# Patient Record
Sex: Female | Born: 1985 | Race: White | Hispanic: No | Marital: Single | Smoking: Current every day smoker
Health system: Southern US, Community
[De-identification: ages and names within clinical notes are randomized; demographics above are authoritative.]

## PROBLEM LIST (undated history)

## (undated) DIAGNOSIS — M069 Rheumatoid arthritis, unspecified: Secondary | ICD-10-CM

## (undated) DIAGNOSIS — R030 Elevated blood-pressure reading, without diagnosis of hypertension: Secondary | ICD-10-CM

## (undated) DIAGNOSIS — O1490 Unspecified pre-eclampsia, unspecified trimester: Secondary | ICD-10-CM

## (undated) HISTORY — PX: DILATION AND CURETTAGE OF UTERUS: SHX78

## (undated) HISTORY — DX: Unspecified pre-eclampsia, unspecified trimester: O14.90

## (undated) HISTORY — DX: Rheumatoid arthritis, unspecified: M06.9

---

## 2009-12-13 DIAGNOSIS — O1490 Unspecified pre-eclampsia, unspecified trimester: Secondary | ICD-10-CM

## 2018-02-07 ENCOUNTER — Encounter: Payer: Self-pay | Admitting: *Deleted

## 2018-02-10 ENCOUNTER — Encounter: Payer: Self-pay | Admitting: Obstetrics & Gynecology

## 2018-02-10 ENCOUNTER — Other Ambulatory Visit (HOSPITAL_COMMUNITY)
Admission: RE | Admit: 2018-02-10 | Discharge: 2018-02-10 | Disposition: A | Discharge status: Home / Self Care | Payer: Medicaid Other | Source: Ambulatory Visit | Attending: Obstetrics & Gynecology | Admitting: Obstetrics & Gynecology

## 2018-02-10 ENCOUNTER — Telehealth: Payer: Self-pay | Admitting: *Deleted

## 2018-02-10 ENCOUNTER — Ambulatory Visit (INDEPENDENT_AMBULATORY_CARE_PROVIDER_SITE_OTHER): Payer: Medicaid Other | Admitting: Obstetrics & Gynecology

## 2018-02-10 ENCOUNTER — Ambulatory Visit: Payer: Medicaid Other | Admitting: Clinical

## 2018-02-10 VITALS — BP 119/77 | HR 105 | Ht 62.0 in | Wt 160.0 lb

## 2018-02-10 DIAGNOSIS — O219 Vomiting of pregnancy, unspecified: Secondary | ICD-10-CM

## 2018-02-10 DIAGNOSIS — O0992 Supervision of high risk pregnancy, unspecified, second trimester: Secondary | ICD-10-CM | POA: Diagnosis present

## 2018-02-10 DIAGNOSIS — Z3A22 22 weeks gestation of pregnancy: Secondary | ICD-10-CM | POA: Diagnosis not present

## 2018-02-10 DIAGNOSIS — O4401 Placenta previa specified as without hemorrhage, first trimester: Secondary | ICD-10-CM

## 2018-02-10 DIAGNOSIS — O4402 Placenta previa specified as without hemorrhage, second trimester: Secondary | ICD-10-CM | POA: Diagnosis not present

## 2018-02-10 DIAGNOSIS — O0990 Supervision of high risk pregnancy, unspecified, unspecified trimester: Secondary | ICD-10-CM

## 2018-02-10 DIAGNOSIS — Z8759 Personal history of other complications of pregnancy, childbirth and the puerperium: Secondary | ICD-10-CM

## 2018-02-10 DIAGNOSIS — Z23 Encounter for immunization: Secondary | ICD-10-CM

## 2018-02-10 DIAGNOSIS — O09292 Supervision of pregnancy with other poor reproductive or obstetric history, second trimester: Secondary | ICD-10-CM | POA: Diagnosis not present

## 2018-02-10 MED ORDER — ONDANSETRON 4 MG PO TBDP: 4.0000 mg | ORAL_TABLET | Freq: Four times a day (QID) | ORAL | 0 refills | Status: DC | PRN

## 2018-02-10 MED ORDER — ASPIRIN EC 81 MG PO TBEC: 81.0000 mg | DELAYED_RELEASE_TABLET | Freq: Every day | ORAL | 2 refills | Status: DC

## 2018-02-10 MED ORDER — FAMOTIDINE 20 MG PO TABS: 20.0000 mg | ORAL_TABLET | Freq: Two times a day (BID) | ORAL | 3 refills | Status: DC

## 2018-02-10 NOTE — Patient Instructions (Signed)
AREA PEDIATRIC/FAMILY PRACTICE PHYSICIANS  [facility] [address], [address] [place], [place]  [address] Phone - [phone]   Fax - [phone]  [facility] [address] [place], [place] [address] Phone - [phone]   Fax - [phone]  [facility] [address] [place], [place]  [address] Phone - [phone]   Fax - [phone]  [facility] [address] [place], [place]  [address] Phone - [phone]   Fax - [phone]  [facility] [address] [place], [place]  [address] Phone - [phone]   Fax - [phone]  [facility] [address] [place], [place]  [address] Phone - [phone]   Fax - [phone]  [facility] [address] [place], [place]  [address] Phone - [phone]   Fax - [phone]  [facility] [address], [address] [place], [place]  [address] Phone - [phone]   Fax - [phone]  [facility] [address] [place], [place]  [address] Phone - [phone]   Fax - [phone] [facility] [address] [place], [place]  [address] Phone - [phone]   Fax - [phone]  [facility] [address] [place], [place]  [address] Phone - [phone]   Fax - [phone]  [facility] [address] [place], [place]  [address] Phone - [phone]   Fax - [phone]  [facility] [address] [place], [place]  [address] Phone - [phone]   Fax - [phone]  [facility] GOSR[facility] [address] [place], [place]  [address] Phone - [phone]  [facility] [address] [place], [place]  [address] Phone - [phone]   Fax - [phone]  [facility] [address] [place], [place]  [address] Phone - [phone]   Fax - [phone]  [facility] [address] [place], [place]  [address] Phone - [phone]   Fax - [phone]  [facility] [address] [place], [place]  [address] Phone - [phone]   Fax - [phone]   [facility] [address] [place], [place]  [address] Phone - [phone]   Fax - [phone]  [facility] [address] [place], [place]  [address] Phone - [phone]   Fax - [phone]  [facility] [address] [place], [place]  [address] Phone - [phone]   Fax - [phone]  [facility] [address] [place], [place]  [address] Phone - [phone]   Fax - [phone]  [facility] [address] [place], [place]  [address] Phone - [phone]   Fax - [phone] [facility] [address] [place], [place]  [address] Phone - [phone]   Fax - [phone]  [facility] [address] [place],   [address] Phone - [phone]   Fax - [phone]  [facility] [address] ,   [address] Phone - [phone]   Fax - [phone]  [facility] Charlene Flemming MD [address] [place]  [address] Phone [phone]  Fax [phone]   

## 2018-02-10 NOTE — Progress Notes (Signed)
Subjective:   Lisa Hall is a 32 y.o. G3P1011 at [redacted]w[redacted]d by early ultrasound as per patient being seen today for her first obstetrical visit here in [place]. She had some prenatal care in [place]; about 2-3 visits.  Her obstetrical history is significant for placental previa diagnosed early in pregnancy (no follow up scan yet), continued nausea and vomiting and history of preeclampsia in late third trimester during last pregnancy. Patient does intend to breast feed. Pregnancy history fully reviewed.  Patient reports nausea and vomiting. Was treated with Phenergan but was very sleepy. Wants non-drowsy antiemetic.  HISTORY: OB History  Gravida Para Term Preterm AB Living  3 1 1  0 1 1  SAB TAB Ectopic Multiple Live Births  1 0 0 0 1    # Outcome Date GA Lbr Len/2nd Weight Sex Delivery Anes PTL Lv  3 Current           2 Term 12/13/09 [redacted]w[redacted]d  5 lb 13 oz (2.637 kg) M Vag-Spont  N      Complications: Preeclampsia  1 SAB            Last pap smear was done earlier this pregnancy and was normal  Past Medical History:  Diagnosis Date  . Preeclampsia    Past Surgical History:  Procedure Laterality Date  . DILATION AND CURETTAGE OF UTERUS     Family History  Problem Relation Age of Onset  . Hypertension Mother   . Diabetes Mother    Social History   Tobacco Use  . Smoking status: Current Every Day Smoker    Types: Cigarettes  . Smokeless tobacco: Never Used  Substance Use Topics  . Alcohol use: Never    Frequency: Never  . Drug use: Never   No Known Allergies Current Outpatient Medications on File Prior to Visit  Medication Sig Dispense Refill  . Prenatal MV-Min-FA-Omega-3 (PRENATAL GUMMIES/DHA & FA) 0.4-32.5 MG CHEW Chew by mouth.     No current facility-administered medications on file prior to visit.     Review of Systems Pertinent items noted in HPI and remainder of comprehensive ROS otherwise negative.  Exam   Vitals:   02/10/18 0859 02/10/18 0901 02/10/18 0902    BP: (!) 144/80 119/77   Pulse: (!) 105    Weight: 160 lb (72.6 kg)    Height:   5\' 2"  (1.575 m)   Fetal Heart Rate (bpm): 139  General: well-developed, well-nourished female in no acute distress  Skin: normal coloration and turgor, no rashes  Neurologic: oriented, normal, negative, normal mood  Extremities: normal strength, tone, and muscle mass, ROM of all joints is normal  HEENT PERRLA, extraocular movement intact and sclera clear, anicteric  Mouth/Teeth mucous membranes moist, pharynx normal without lesions and dental hygiene good  Neck supple and no masses  Cardiovascular: regular rate and rhythm  Respiratory:  no respiratory distress, normal breath sounds  Abdomen: soft, non-tender; bowel sounds normal; no masses,  no organomegaly    Assessment:   Pregnancy: G3P1011 Patient Active Problem List   Diagnosis Date Noted  . Supervision of high risk pregnancy, antepartum 02/10/2018  . History of pre-eclampsia 02/10/2018  . Placenta previa antepartum in first trimester 02/10/2018     Plan:  1. Nausea and vomiting during pregnancy Antiemetics ordered - ondansetron (ZOFRAN ODT) 4 MG disintegrating tablet; Take 1 tablet (4 mg total) by mouth every 6 (six) hours as needed for nausea.  Dispense: 20 tablet; Refill: 0 - famotidine (PEPCID) 20  MG tablet; Take 1 tablet (20 mg total) by mouth 2 (two) times daily.  Dispense: 60 tablet; Refill: 3  2. History of pre-eclampsia Baseline labs obtained for now. ASA ordered - US MFM OB DETAIL +14 WK; Future - Comprehensive metabolic panel - Protein / creatinine ratio, urine - aspirin EC 81 MG tablet; Take 1 tablet (81 mg total) by mouth daily. Take after 12 weeks for prevention of preeclampsia later in pregnancy  Dispense: 300 tablet; Refill: 2  3. Placenta previa antepartum in first trimester Will do follow up scan - [place] MFM OB DETAIL +14 WK; Future  4. Supervision of high risk pregnancy, antepartum - Flu Vaccine QUAD 36+ mos IM - [place]  MFM OB DETAIL +14 WK; Future - Enroll patient in Babyscripts Program - Obstetric Panel, Including HIV - Hemoglobinopathy evaluation - AFP TETRA - Comprehensive metabolic panel - Hemoglobin A1c - Protein / creatinine ratio, urine - Cystic Fibrosis Mutation 97 - Culture, OB Urine - TSH - Urine cytology ancillary only Prenatal and baseline labs drawn. Continue prenatal vitamins. Genetic Screening discussed, Quad screen: ordered. Ultrasound discussed; fetal anatomic survey: ordered. Problem list reviewed and updated. The nature of [facility] with multiple MDs and other Advanced Practice Providers was explained to patient; also emphasized that residents, students are part of our team. Routine obstetric precautions reviewed. Return in about 4 weeks (around 03/10/2018) for OB Visit (HOB).     Jaynie Collins, MD, FACOG Obstetrician & Gynecologist, [facility] for [employer], [facility]

## 2018-02-10 NOTE — Telephone Encounter (Signed)
Lisa Hall left a message 02/07/18 pm starting someone called earlier and she just missed the call.  I called Lisa Hall and informed her I think the call was re: her appointment for today. She states she is here trying to find our office. Directions given.

## 2018-02-10 NOTE — BH Specialist Note (Signed)
Integrated Behavioral Health Initial Visit  MRN: [id number] Name: Lisa Hall  Number of Integrated Behavioral Health Clinician visits:: 1/6 Session Start time: 10:35  Session End time: 10:47 Total time: 15 minutes  Type of Service: Integrated Behavioral Health- Individual/Family Interpretor:No. Interpretor Name and Language: n/a   Warm Hand Off Completed.       SUBJECTIVE: Lisa Hall is a 32 y.o. female accompanied by Spouse Patient was referred by Jaynie Collins, MD for Initial OB introduction to integrated behavioral health services . Patient reports the following symptoms/concerns: Pt states her primary concern today is an increase in anxiety, attributed to difficulty falling back asleep at night, along with being unfamiliar with this hospital.  Duration of problem: Current pregnancy; Severity of problem: mild  OBJECTIVE: Mood: Anxious and Affect: Appropriate Risk of harm to self or others: No plan to harm self or others  LIFE CONTEXT: Family and Social: Pt lives with FOB and 32yo School/Work: - Self-Care: - Life Changes: Current pregnancy   GOALS ADDRESSED: Patient will: 1. Reduce symptoms of: anxiety 2. Increase knowledge and/or ability of: stress reduction  3. Demonstrate ability to: Increase healthy adjustment to current life circumstances  INTERVENTIONS: Interventions utilized: Sleep Hygiene and Psychoeducation and/or Health Education  Standardized Assessments completed: GAD-7 and PHQ 9  ASSESSMENT: Patient currently experiencing Supervision of high-risk pregnancy, antepartum.   Patient may benefit from Initial OB introduction to integrated behavioral health services .  PLAN: 1. Follow up with behavioral health clinician on : One month 2. Behavioral recommendations:  -Continue taking prenatal vitamin, as recommended by medical provider -Use sleep app or other sleep sound that helps both patient and FOB improve sleep quality -Watch [facility] Virtual  Tour at home with FOB 3. Referral(s): Integrated [employer] (In Clinic) 4. "From scale of 1-10, how likely are you to follow plan?": 10  Rae Lips, LCSW    Depression screen [facility] Xxx 2/9 02/10/2018  Decreased Interest 0  Down, Depressed, Hopeless 0  PHQ - 2 Score 0  Altered sleeping 2  Tired, decreased energy 2  Change in appetite 1  Feeling bad or failure about yourself  0  Trouble concentrating 0  Moving slowly or fidgety/restless 0  Suicidal thoughts 0  PHQ-9 Score 5   GAD 7 : Generalized Anxiety Score 02/10/2018  Nervous, Anxious, on Edge 2  Control/stop worrying 0  Worry too much - different things 0  Trouble relaxing 1  Restless 0  Easily annoyed or irritable 0  Afraid - awful might happen 1  Total GAD 7 Score 4

## 2018-02-10 NOTE — Progress Notes (Signed)
Patient reports she had 2-3 prenatal visits with OB provider in [place]; states she had an ultrasound & told she had placenta previa

## 2018-02-11 ENCOUNTER — Encounter: Payer: Self-pay | Admitting: *Deleted

## 2018-02-11 LAB — PROTEIN / CREATININE RATIO, URINE
Creatinine, Urine: 244.7 mg/dL
PROTEIN UR: 23.3 mg/dL
PROTEIN/CREAT RATIO: 95 mg/g{creat} (ref 0–200)

## 2018-02-11 LAB — URINE CYTOLOGY ANCILLARY ONLY
CHLAMYDIA, DNA PROBE: NEGATIVE
NEISSERIA GONORRHEA: NEGATIVE

## 2018-02-12 LAB — URINE CULTURE, OB REFLEX

## 2018-02-12 LAB — CULTURE, OB URINE

## 2018-02-13 ENCOUNTER — Encounter (HOSPITAL_COMMUNITY): Payer: Self-pay

## 2018-02-13 ENCOUNTER — Ambulatory Visit (HOSPITAL_COMMUNITY)
Admission: RE | Admit: 2018-02-13 | Discharge: 2018-02-13 | Disposition: A | Discharge status: Home / Self Care | Payer: Medicaid Other | Source: Ambulatory Visit | Attending: Obstetrics & Gynecology | Admitting: Obstetrics & Gynecology

## 2018-02-13 DIAGNOSIS — Z8759 Personal history of other complications of pregnancy, childbirth and the puerperium: Secondary | ICD-10-CM

## 2018-02-13 DIAGNOSIS — Z3A22 22 weeks gestation of pregnancy: Secondary | ICD-10-CM | POA: Diagnosis not present

## 2018-02-13 DIAGNOSIS — O09292 Supervision of pregnancy with other poor reproductive or obstetric history, second trimester: Secondary | ICD-10-CM | POA: Insufficient documentation

## 2018-02-13 DIAGNOSIS — Z363 Encounter for antenatal screening for malformations: Secondary | ICD-10-CM | POA: Diagnosis not present

## 2018-02-13 DIAGNOSIS — O4402 Placenta previa specified as without hemorrhage, second trimester: Secondary | ICD-10-CM | POA: Diagnosis not present

## 2018-02-13 DIAGNOSIS — O0990 Supervision of high risk pregnancy, unspecified, unspecified trimester: Secondary | ICD-10-CM

## 2018-02-13 DIAGNOSIS — O4401 Placenta previa specified as without hemorrhage, first trimester: Secondary | ICD-10-CM

## 2018-02-14 ENCOUNTER — Other Ambulatory Visit (HOSPITAL_COMMUNITY): Payer: Self-pay | Admitting: *Deleted

## 2018-02-14 DIAGNOSIS — Z362 Encounter for other antenatal screening follow-up: Secondary | ICD-10-CM

## 2018-02-17 LAB — OBSTETRIC PANEL, INCLUDING HIV
ANTIBODY SCREEN: NEGATIVE
BASOS: 0 %
Basophils Absolute: 0.1 10*3/uL (ref 0.0–0.2)
EOS (ABSOLUTE): 0.2 10*3/uL (ref 0.0–0.4)
Eos: 1 %
HEMOGLOBIN: 12.4 g/dL (ref 11.1–15.9)
HEP B S AG: NEGATIVE
HIV Screen 4th Generation wRfx: NONREACTIVE
Hematocrit: 37.1 % (ref 34.0–46.6)
Immature Grans (Abs): 0.2 10*3/uL — ABNORMAL HIGH (ref 0.0–0.1)
Immature Granulocytes: 1 %
LYMPHS: 15 %
Lymphocytes Absolute: 2.7 10*3/uL (ref 0.7–3.1)
MCH: 31.7 pg (ref 26.6–33.0)
MCHC: 33.4 g/dL (ref 31.5–35.7)
MCV: 95 fL (ref 79–97)
MONOCYTES: 6 %
Monocytes Absolute: 1 10*3/uL — ABNORMAL HIGH (ref 0.1–0.9)
Neutrophils Absolute: 13.9 10*3/uL — ABNORMAL HIGH (ref 1.4–7.0)
Neutrophils: 77 %
Platelets: 327 10*3/uL (ref 150–450)
RBC: 3.91 x10E6/uL (ref 3.77–5.28)
RDW: 12.4 % (ref 12.3–15.4)
RPR: NONREACTIVE
RUBELLA: 2.45 {index} (ref >0.99)
Rh Factor: POSITIVE
WBC: 17.9 10*3/uL — ABNORMAL HIGH (ref 3.4–10.8)

## 2018-02-17 LAB — COMPREHENSIVE METABOLIC PANEL
A/G RATIO: 1.7 (ref 1.2–2.2)
ALBUMIN: 4.2 g/dL (ref 3.5–5.5)
ALK PHOS: 106 IU/L (ref 39–117)
ALT: 8 IU/L (ref 0–32)
AST: 14 IU/L (ref 0–40)
BUN / CREAT RATIO: 13 (ref 9–23)
BUN: 7 mg/dL (ref 6–20)
CHLORIDE: 101 mmol/L (ref 96–106)
CO2: 21 mmol/L (ref 20–29)
Calcium: 9.1 mg/dL (ref 8.7–10.2)
Creatinine, Ser: 0.52 mg/dL — ABNORMAL LOW (ref 0.57–1.00)
GFR calc Af Amer: 148 mL/min/{1.73_m2} (ref >59)
GFR calc non Af Amer: 129 mL/min/{1.73_m2} (ref >59)
GLOBULIN, TOTAL: 2.5 g/dL (ref 1.5–4.5)
GLUCOSE: 68 mg/dL (ref 65–99)
Potassium: 3.7 mmol/L (ref 3.5–5.2)
SODIUM: 139 mmol/L (ref 134–144)
Total Protein: 6.7 g/dL (ref 6.0–8.5)

## 2018-02-17 LAB — AFP TETRA
DIA Value (EIA): 874.42 pg/mL
DSR (By Age)    1 IN: 648
GESTATIONAL AGE AFP: 22.4 wk
MATERNAL AGE AT EDD: 30.6 a
MSAFP Mom: 1.14
MSAFP: 85 ng/mL
MSHCG: 42428 m[IU]/mL
OSB RISK: 7850
T18 (By Age): 1:2523 {titer}
WEIGHT: 160 [lb_av]
uE3 Value: 2.12 ng/mL

## 2018-02-17 LAB — HEMOGLOBIN A1C
Est. average glucose Bld gHb Est-mCnc: 100 mg/dL
HEMOGLOBIN A1C: 5.1 % (ref 4.8–5.6)

## 2018-02-17 LAB — TSH: TSH: 1.11 u[IU]/mL (ref 0.450–4.500)

## 2018-02-17 LAB — HEMOGLOBINOPATHY EVALUATION
HEMOGLOBIN A2 QUANTITATION: 1.7 % — AB (ref 1.8–3.2)
HEMOGLOBIN F QUANTITATION: 0 % (ref 0.0–2.0)
HGB C: 0 %
HGB S: 0 %
HGB VARIANT: 0 %
Hgb A: 98.3 % (ref 96.4–98.8)

## 2018-02-17 LAB — CYSTIC FIBROSIS MUTATION 97: GENE DIS ANAL CARRIER INTERP BLD/T-IMP: NOT DETECTED

## 2018-03-14 ENCOUNTER — Other Ambulatory Visit (HOSPITAL_COMMUNITY): Payer: Self-pay | Admitting: *Deleted

## 2018-03-14 ENCOUNTER — Ambulatory Visit (HOSPITAL_COMMUNITY)
Admission: RE | Admit: 2018-03-14 | Discharge: 2018-03-14 | Disposition: A | Discharge status: Home / Self Care | Payer: Medicaid Other | Source: Ambulatory Visit | Attending: Obstetrics & Gynecology | Admitting: Obstetrics & Gynecology

## 2018-03-14 ENCOUNTER — Encounter (HOSPITAL_COMMUNITY): Payer: Self-pay

## 2018-03-14 DIAGNOSIS — Z3A26 26 weeks gestation of pregnancy: Secondary | ICD-10-CM | POA: Diagnosis not present

## 2018-03-14 DIAGNOSIS — Z362 Encounter for other antenatal screening follow-up: Secondary | ICD-10-CM

## 2018-03-14 DIAGNOSIS — O09293 Supervision of pregnancy with other poor reproductive or obstetric history, third trimester: Secondary | ICD-10-CM

## 2018-03-14 DIAGNOSIS — O09292 Supervision of pregnancy with other poor reproductive or obstetric history, second trimester: Secondary | ICD-10-CM

## 2018-03-14 DIAGNOSIS — O321XX0 Maternal care for breech presentation, not applicable or unspecified: Secondary | ICD-10-CM | POA: Diagnosis not present

## 2018-03-14 DIAGNOSIS — Z3689 Encounter for other specified antenatal screening: Secondary | ICD-10-CM | POA: Diagnosis not present

## 2018-03-17 ENCOUNTER — Encounter: Payer: Self-pay | Admitting: Obstetrics & Gynecology

## 2018-03-17 ENCOUNTER — Telehealth: Payer: Self-pay | Admitting: *Deleted

## 2018-03-17 NOTE — Telephone Encounter (Signed)
Lisa Hall left a voice message this am stating she was supposed to have an appointment today but had to cancel it because she had a death in the family. States she is on her way now out of state to funeral . States she stopped to pee and thinks she lost her mucous plug. Wants to know if she should turn around or what she should do.   I called Lisa Hall and she states it was just once , she had some mucous that looked like "snot".  She denies pressure, denies contractions, denies bleeding. States her hips are sore but she was up cooking a lot this weekend.  I advised her some mucous discharge can be normal in pregnancy. Also discussed hip soreness can be normal as body changes to accommodate pregnancy. I also advised her if she starts having contractions, bleeding, pressure , etc to go to closest hospital. She voices understanding.

## 2018-04-11 ENCOUNTER — Encounter (HOSPITAL_COMMUNITY): Payer: Self-pay

## 2018-04-11 ENCOUNTER — Other Ambulatory Visit (HOSPITAL_COMMUNITY): Payer: Self-pay | Admitting: Maternal and Fetal Medicine

## 2018-04-11 ENCOUNTER — Other Ambulatory Visit: Payer: Self-pay

## 2018-04-11 ENCOUNTER — Ambulatory Visit (HOSPITAL_COMMUNITY)
Admission: RE | Admit: 2018-04-11 | Discharge: 2018-04-11 | Disposition: A | Discharge status: Home / Self Care | Payer: Medicaid Other | Source: Ambulatory Visit | Attending: Obstetrics & Gynecology | Admitting: Obstetrics & Gynecology

## 2018-04-11 DIAGNOSIS — Z3A30 30 weeks gestation of pregnancy: Secondary | ICD-10-CM | POA: Insufficient documentation

## 2018-04-11 DIAGNOSIS — O321XX0 Maternal care for breech presentation, not applicable or unspecified: Secondary | ICD-10-CM | POA: Diagnosis not present

## 2018-04-11 DIAGNOSIS — Z362 Encounter for other antenatal screening follow-up: Secondary | ICD-10-CM | POA: Diagnosis not present

## 2018-04-11 DIAGNOSIS — O219 Vomiting of pregnancy, unspecified: Secondary | ICD-10-CM

## 2018-04-11 DIAGNOSIS — O09293 Supervision of pregnancy with other poor reproductive or obstetric history, third trimester: Secondary | ICD-10-CM | POA: Diagnosis present

## 2018-04-14 MED ORDER — ONDANSETRON 4 MG PO TBDP: 4.0000 mg | ORAL_TABLET | Freq: Four times a day (QID) | ORAL | 0 refills | Status: DC | PRN

## 2018-04-14 NOTE — Telephone Encounter (Signed)
Pharmacy requested refill for Zofran ODT , Per protocol refilled Rx.

## 2018-04-23 NOTE — L&D Delivery Note (Signed)
Patient: Lisa Hall MRN: [id number]  GBS status: Negative, IAP given: None   Patient is a 32 y.o. now G2P2 s/p NSVD at [redacted]w[redacted]d, who was admitted for SOL. AROM 2h 55m prior to delivery with clear fluid. Pregnancy complicated by gHTN and GDM.    Delivery Note At 6:04 PM a viable female was delivered via Vaginal, Spontaneous (Presentation: OA).  APGAR: 9, 9; weight pending.   Placenta status: spontaneous, intact.  Cord: 3 vessel with the following complications: loose nuchal x1.   Anesthesia:  Epidural  Episiotomy: None Lacerations: 2nd degree Suture Repair: 3.0 vicryl rapide Est. Blood Loss (mL):  252   Head delivered OA. Compound right arm noted. Shoulder and body delivered in usual fashion. Loose nuchal x1 noted after delivery and easily reduced. Infant with spontaneous cry, placed on mother's abdomen, dried and bulb suctioned. Cord clamped x 2 after 1-minute delay, and cut by family member. Cord blood drawn. Placenta delivered spontaneously with gentle cord traction. Fundus firm with massage and Pitocin. Perineum inspected and found to have 2nd degree laceration, which was repaired with 3.0 vicryl rapide with good hemostasis achieved.  Mom to postpartum.  Baby to Couplet care / Skin to Skin.  Lisa Hall 06/18/2018, 6:42 PM   

## 2018-05-01 ENCOUNTER — Ambulatory Visit (INDEPENDENT_AMBULATORY_CARE_PROVIDER_SITE_OTHER): Payer: Medicaid Other | Admitting: Obstetrics & Gynecology

## 2018-05-01 VITALS — BP 136/86 | HR 92 | Wt 173.0 lb

## 2018-05-01 DIAGNOSIS — O0993 Supervision of high risk pregnancy, unspecified, third trimester: Secondary | ICD-10-CM | POA: Diagnosis present

## 2018-05-01 DIAGNOSIS — O0990 Supervision of high risk pregnancy, unspecified, unspecified trimester: Secondary | ICD-10-CM

## 2018-05-01 DIAGNOSIS — Z3A33 33 weeks gestation of pregnancy: Secondary | ICD-10-CM | POA: Diagnosis not present

## 2018-05-01 DIAGNOSIS — Z23 Encounter for immunization: Secondary | ICD-10-CM

## 2018-05-01 DIAGNOSIS — Z8759 Personal history of other complications of pregnancy, childbirth and the puerperium: Secondary | ICD-10-CM

## 2018-05-01 DIAGNOSIS — O09293 Supervision of pregnancy with other poor reproductive or obstetric history, third trimester: Secondary | ICD-10-CM | POA: Diagnosis not present

## 2018-05-01 NOTE — Progress Notes (Signed)
   PRENATAL VISIT NOTE  Subjective:  Lisa Hall is a 33 y.o. G3P1011 at 4145w2d being seen today for ongoing prenatal care.  She is currently monitored for the following issues for this high-risk pregnancy and has Supervision of high risk pregnancy, antepartum; History of pre-eclampsia; and Placenta previa antepartum in first trimester on their problem list.  Patient reports no complaints.  Contractions: Not present. Vag. Bleeding: None.  Movement: Present. Denies leaking of fluid.   The following portions of the patient's history were reviewed and updated as appropriate: allergies, current medications, past family history, past medical history, past social history, past surgical history and problem list. Problem list updated.  Objective:   Vitals:   05/01/18 0948  BP: 136/86  Pulse: 92  Weight: 173 lb (78.5 kg)    Fetal Status: Fetal Heart Rate (bpm): 156   Movement: Present     General:  Alert, oriented and cooperative. Patient is in no acute distress.  Skin: Skin is warm and dry. No rash noted.   Cardiovascular: Normal heart rate noted  Respiratory: Normal respiratory effort, no problems with respiration noted  Abdomen: Soft, gravid, appropriate for gestational age.  Pain/Pressure: Present     Pelvic: Cervical exam deferred        Extremities: Normal range of motion.  Edema: Trace  Mental Status: Normal mood and affect. Normal behavior. Normal judgment and thought content.   Assessment and Plan:  Pregnancy: G3P1011 at 7645w2d  1. Supervision of high risk pregnancy, antepartum  - Tdap vaccine greater than or equal to 7yo IM - HIV Antibody (routine testing w rflx) - RPR - CBC - 2 hour GTT tomorrow (when she can stay as husband needs to go to work today)  2. History of pre-eclampsia - baby asa daily  Preterm labor symptoms and general obstetric precautions including but not limited to vaginal bleeding, contractions, leaking of fluid and fetal movement were reviewed in detail  with the patient. Please refer to After Visit Summary for other counseling recommendations.  No follow-ups on file.  No future appointments.  Allie BossierMyra C Juquan Reznick, MD

## 2018-05-02 ENCOUNTER — Other Ambulatory Visit: Payer: Self-pay | Admitting: *Deleted

## 2018-05-02 DIAGNOSIS — O0990 Supervision of high risk pregnancy, unspecified, unspecified trimester: Secondary | ICD-10-CM

## 2018-05-02 LAB — CBC
Hematocrit: 33.6 % — ABNORMAL LOW (ref 34.0–46.6)
Hemoglobin: 11.1 g/dL (ref 11.1–15.9)
MCH: 29.7 pg (ref 26.6–33.0)
MCHC: 33 g/dL (ref 31.5–35.7)
MCV: 90 fL (ref 79–97)
PLATELETS: 378 10*3/uL (ref 150–450)
RBC: 3.74 x10E6/uL — ABNORMAL LOW (ref 3.77–5.28)
RDW: 12 % (ref 11.7–15.4)
WBC: 16.1 10*3/uL — AB (ref 3.4–10.8)

## 2018-05-02 LAB — RPR: RPR Ser Ql: NONREACTIVE

## 2018-05-02 LAB — HIV ANTIBODY (ROUTINE TESTING W REFLEX): HIV Screen 4th Generation wRfx: NONREACTIVE

## 2018-05-05 ENCOUNTER — Other Ambulatory Visit: Payer: Medicaid Other

## 2018-05-05 DIAGNOSIS — O0990 Supervision of high risk pregnancy, unspecified, unspecified trimester: Secondary | ICD-10-CM

## 2018-05-06 ENCOUNTER — Encounter: Payer: Self-pay | Admitting: Obstetrics & Gynecology

## 2018-05-06 ENCOUNTER — Telehealth: Payer: Self-pay

## 2018-05-06 DIAGNOSIS — O99810 Abnormal glucose complicating pregnancy: Secondary | ICD-10-CM | POA: Insufficient documentation

## 2018-05-06 LAB — GLUCOSE TOLERANCE, 2 HOURS W/ 1HR
GLUCOSE, 1 HOUR: 185 mg/dL — AB (ref 65–179)
GLUCOSE, 2 HOUR: 98 mg/dL (ref 65–152)
Glucose, Fasting: 79 mg/dL (ref 65–91)

## 2018-05-06 NOTE — Telephone Encounter (Signed)
Per Dr. Marice Potter pt GTT came back abnormal.  Notified pt of results and gave pt appt with Diabetes Education for 05/15/18 @ 0800.  Pt stated understanding with no further questions.

## 2018-05-15 ENCOUNTER — Other Ambulatory Visit: Payer: Self-pay

## 2018-05-15 ENCOUNTER — Telehealth: Payer: Self-pay | Admitting: Obstetrics and Gynecology

## 2018-05-15 ENCOUNTER — Encounter: Payer: Self-pay | Admitting: Obstetrics and Gynecology

## 2018-05-15 NOTE — Telephone Encounter (Signed)
Called patient to get appointment rescheduled. She canceled via team health.

## 2018-06-17 ENCOUNTER — Inpatient Hospital Stay (HOSPITAL_COMMUNITY)
Admission: AD | Admit: 2018-06-17 | Discharge: 2018-06-20 | DRG: 807 | Disposition: A | Discharge status: Home / Self Care | Payer: Medicaid Other | Attending: Obstetrics and Gynecology | Admitting: Obstetrics and Gynecology

## 2018-06-17 ENCOUNTER — Other Ambulatory Visit: Payer: Self-pay

## 2018-06-17 DIAGNOSIS — D649 Anemia, unspecified: Secondary | ICD-10-CM | POA: Diagnosis present

## 2018-06-17 DIAGNOSIS — R030 Elevated blood-pressure reading, without diagnosis of hypertension: Secondary | ICD-10-CM

## 2018-06-17 DIAGNOSIS — O4401 Placenta previa specified as without hemorrhage, first trimester: Secondary | ICD-10-CM

## 2018-06-17 DIAGNOSIS — Z8759 Personal history of other complications of pregnancy, childbirth and the puerperium: Secondary | ICD-10-CM

## 2018-06-17 DIAGNOSIS — O0990 Supervision of high risk pregnancy, unspecified, unspecified trimester: Secondary | ICD-10-CM

## 2018-06-17 DIAGNOSIS — Z23 Encounter for immunization: Secondary | ICD-10-CM

## 2018-06-17 DIAGNOSIS — Z3A40 40 weeks gestation of pregnancy: Secondary | ICD-10-CM

## 2018-06-17 DIAGNOSIS — O9902 Anemia complicating childbirth: Secondary | ICD-10-CM | POA: Diagnosis present

## 2018-06-17 DIAGNOSIS — O134 Gestational [pregnancy-induced] hypertension without significant proteinuria, complicating childbirth: Principal | ICD-10-CM | POA: Diagnosis present

## 2018-06-17 DIAGNOSIS — O99334 Smoking (tobacco) complicating childbirth: Secondary | ICD-10-CM | POA: Diagnosis present

## 2018-06-17 DIAGNOSIS — O99810 Abnormal glucose complicating pregnancy: Secondary | ICD-10-CM

## 2018-06-17 DIAGNOSIS — O24420 Gestational diabetes mellitus in childbirth, diet controlled: Secondary | ICD-10-CM | POA: Diagnosis present

## 2018-06-17 DIAGNOSIS — F17210 Nicotine dependence, cigarettes, uncomplicated: Secondary | ICD-10-CM | POA: Diagnosis present

## 2018-06-17 DIAGNOSIS — O326XX0 Maternal care for compound presentation, not applicable or unspecified: Secondary | ICD-10-CM | POA: Diagnosis present

## 2018-06-17 DIAGNOSIS — O139 Gestational [pregnancy-induced] hypertension without significant proteinuria, unspecified trimester: Secondary | ICD-10-CM | POA: Diagnosis present

## 2018-06-17 HISTORY — DX: Elevated blood-pressure reading, without diagnosis of hypertension: R03.0

## 2018-06-18 ENCOUNTER — Other Ambulatory Visit: Payer: Self-pay

## 2018-06-18 ENCOUNTER — Inpatient Hospital Stay (HOSPITAL_COMMUNITY): Payer: Medicaid Other | Admitting: Anesthesiology

## 2018-06-18 ENCOUNTER — Encounter (HOSPITAL_COMMUNITY): Payer: Self-pay

## 2018-06-18 DIAGNOSIS — O134 Gestational [pregnancy-induced] hypertension without significant proteinuria, complicating childbirth: Secondary | ICD-10-CM | POA: Diagnosis present

## 2018-06-18 DIAGNOSIS — O139 Gestational [pregnancy-induced] hypertension without significant proteinuria, unspecified trimester: Secondary | ICD-10-CM | POA: Diagnosis present

## 2018-06-18 DIAGNOSIS — O9902 Anemia complicating childbirth: Secondary | ICD-10-CM | POA: Diagnosis present

## 2018-06-18 DIAGNOSIS — O24420 Gestational diabetes mellitus in childbirth, diet controlled: Secondary | ICD-10-CM | POA: Diagnosis present

## 2018-06-18 DIAGNOSIS — O26893 Other specified pregnancy related conditions, third trimester: Secondary | ICD-10-CM | POA: Diagnosis present

## 2018-06-18 DIAGNOSIS — Z23 Encounter for immunization: Secondary | ICD-10-CM | POA: Diagnosis not present

## 2018-06-18 DIAGNOSIS — Z3A40 40 weeks gestation of pregnancy: Secondary | ICD-10-CM | POA: Diagnosis not present

## 2018-06-18 DIAGNOSIS — R030 Elevated blood-pressure reading, without diagnosis of hypertension: Secondary | ICD-10-CM | POA: Diagnosis present

## 2018-06-18 DIAGNOSIS — O99334 Smoking (tobacco) complicating childbirth: Secondary | ICD-10-CM | POA: Diagnosis present

## 2018-06-18 DIAGNOSIS — D649 Anemia, unspecified: Secondary | ICD-10-CM | POA: Diagnosis present

## 2018-06-18 DIAGNOSIS — O326XX0 Maternal care for compound presentation, not applicable or unspecified: Secondary | ICD-10-CM | POA: Diagnosis present

## 2018-06-18 DIAGNOSIS — F17210 Nicotine dependence, cigarettes, uncomplicated: Secondary | ICD-10-CM | POA: Diagnosis present

## 2018-06-18 HISTORY — DX: Elevated blood-pressure reading, without diagnosis of hypertension: R03.0

## 2018-06-18 LAB — COMPREHENSIVE METABOLIC PANEL
ALT: 10 U/L (ref 0–44)
AST: 15 U/L (ref 15–41)
Albumin: 2.4 g/dL — ABNORMAL LOW (ref 3.5–5.0)
Alkaline Phosphatase: 221 U/L — ABNORMAL HIGH (ref 38–126)
Anion gap: 9 (ref 5–15)
BUN: 8 mg/dL (ref 6–20)
CO2: 21 mmol/L — ABNORMAL LOW (ref 22–32)
Calcium: 9.3 mg/dL (ref 8.9–10.3)
Chloride: 106 mmol/L (ref 98–111)
Creatinine, Ser: 0.64 mg/dL (ref 0.44–1.00)
Glucose, Bld: 78 mg/dL (ref 70–99)
Potassium: 4 mmol/L (ref 3.5–5.1)
Sodium: 136 mmol/L (ref 135–145)
Total Bilirubin: 0.7 mg/dL (ref 0.3–1.2)
Total Protein: 6 g/dL — ABNORMAL LOW (ref 6.5–8.1)

## 2018-06-18 LAB — TYPE AND SCREEN
ABO/RH(D): A POS
Antibody Screen: NEGATIVE

## 2018-06-18 LAB — CBC
HCT: 33.5 % — ABNORMAL LOW (ref 36.0–46.0)
Hemoglobin: 10.3 g/dL — ABNORMAL LOW (ref 12.0–15.0)
MCH: 25.4 pg — ABNORMAL LOW (ref 26.0–34.0)
MCHC: 30.7 g/dL (ref 30.0–36.0)
MCV: 82.5 fL (ref 80.0–100.0)
Platelets: 303 10*3/uL (ref 150–400)
RBC: 4.06 MIL/uL (ref 3.87–5.11)
RDW: 14.6 % (ref 11.5–15.5)
WBC: 14.3 10*3/uL — ABNORMAL HIGH (ref 4.0–10.5)
nRBC: 0.1 % (ref 0.0–0.2)

## 2018-06-18 LAB — PROTEIN / CREATININE RATIO, URINE: CREATININE, URINE: 38.84 mg/dL

## 2018-06-18 LAB — GLUCOSE, CAPILLARY
GLUCOSE-CAPILLARY: 83 mg/dL (ref 70–99)
Glucose-Capillary: 84 mg/dL (ref 70–99)
Glucose-Capillary: 71 mg/dL (ref 70–99)
Glucose-Capillary: 77 mg/dL (ref 70–99)

## 2018-06-18 LAB — GROUP B STREP BY PCR: Group B strep by PCR: NEGATIVE

## 2018-06-18 LAB — ABO/RH: ABO/RH(D): A POS

## 2018-06-18 LAB — RPR: RPR Ser Ql: NONREACTIVE

## 2018-06-18 MED ORDER — ACETAMINOPHEN 325 MG PO TABS: 650.0000 mg | ORAL_TABLET | ORAL | Status: DC | PRN

## 2018-06-18 MED ORDER — SENNOSIDES-DOCUSATE SODIUM 8.6-50 MG PO TABS
2.0000 | ORAL_TABLET | ORAL | Status: DC
  Administered 2018-06-19 – 2018-06-20 (×2): 2 via ORAL
  Filled 2018-06-18 (×2): qty 2

## 2018-06-18 MED ORDER — DIPHENHYDRAMINE HCL 25 MG PO CAPS: 25.0000 mg | ORAL_CAPSULE | Freq: Four times a day (QID) | ORAL | Status: DC | PRN

## 2018-06-18 MED ORDER — COCONUT OIL OIL
1.0000 "application " | TOPICAL_OIL | Status: DC | PRN
  Administered 2018-06-19: 1 via TOPICAL

## 2018-06-18 MED ORDER — FENTANYL CITRATE (PF) 100 MCG/2ML IJ SOLN
100.0000 ug | Freq: Once | INTRAMUSCULAR | Status: AC
  Administered 2018-06-18: 100 ug via EPIDURAL

## 2018-06-18 MED ORDER — WITCH HAZEL-GLYCERIN EX PADS: 1.0000 "application " | MEDICATED_PAD | CUTANEOUS | Status: DC | PRN

## 2018-06-18 MED ORDER — BUPIVACAINE HCL (PF) 0.25 % IJ SOLN
INTRAMUSCULAR | Status: DC | PRN
  Administered 2018-06-18: 8 mL via EPIDURAL

## 2018-06-18 MED ORDER — OXYCODONE-ACETAMINOPHEN 5-325 MG PO TABS: 1.0000 | ORAL_TABLET | ORAL | Status: DC | PRN

## 2018-06-18 MED ORDER — LABETALOL HCL 5 MG/ML IV SOLN: 20.0000 mg | INTRAVENOUS | Status: DC | PRN

## 2018-06-18 MED ORDER — PHENYLEPHRINE 40 MCG/ML (10ML) SYRINGE FOR IV PUSH (FOR BLOOD PRESSURE SUPPORT): 80.0000 ug | PREFILLED_SYRINGE | INTRAVENOUS | Status: DC | PRN

## 2018-06-18 MED ORDER — TERBUTALINE SULFATE 1 MG/ML IJ SOLN: 0.2500 mg | Freq: Once | INTRAMUSCULAR | Status: DC | PRN

## 2018-06-18 MED ORDER — LIDOCAINE HCL (PF) 1 % IJ SOLN
INTRAMUSCULAR | Status: DC | PRN
  Administered 2018-06-18: 5 mL via EPIDURAL

## 2018-06-18 MED ORDER — FENTANYL CITRATE (PF) 100 MCG/2ML IJ SOLN
50.0000 ug | INTRAMUSCULAR | Status: DC | PRN
  Administered 2018-06-18 (×2): 100 ug via INTRAVENOUS
  Administered 2018-06-18: 50 ug via INTRAVENOUS
  Administered 2018-06-18: 100 ug via INTRAVENOUS
  Filled 2018-06-18 (×6): qty 2

## 2018-06-18 MED ORDER — FENTANYL CITRATE (PF) 100 MCG/2ML IJ SOLN
INTRAMUSCULAR | Status: DC | PRN
  Administered 2018-06-18: 100 ug via EPIDURAL
  Administered 2018-06-18: 2 ug via EPIDURAL

## 2018-06-18 MED ORDER — BENZOCAINE-MENTHOL 20-0.5 % EX AERO
1.0000 "application " | INHALATION_SPRAY | CUTANEOUS | Status: DC | PRN
  Administered 2018-06-18: 1 via TOPICAL
  Filled 2018-06-18: qty 56

## 2018-06-18 MED ORDER — ONDANSETRON HCL 4 MG PO TABS: 4.0000 mg | ORAL_TABLET | ORAL | Status: DC | PRN

## 2018-06-18 MED ORDER — SOD CITRATE-CITRIC ACID 500-334 MG/5ML PO SOLN
30.0000 mL | ORAL | Status: DC | PRN
  Administered 2018-06-18: 30 mL via ORAL
  Filled 2018-06-18: qty 15

## 2018-06-18 MED ORDER — LACTATED RINGERS IV SOLN: 500.0000 mL | Freq: Once | INTRAVENOUS | Status: DC

## 2018-06-18 MED ORDER — FENTANYL CITRATE (PF) 100 MCG/2ML IJ SOLN
50.0000 ug | Freq: Once | INTRAMUSCULAR | Status: AC
  Administered 2018-06-18: 50 ug via EPIDURAL

## 2018-06-18 MED ORDER — LACTATED RINGERS IV SOLN
INTRAVENOUS | Status: DC
  Administered 2018-06-18 (×3): via INTRAVENOUS

## 2018-06-18 MED ORDER — LIDOCAINE HCL (PF) 1 % IJ SOLN: 30.0000 mL | INTRAMUSCULAR | Status: DC | PRN

## 2018-06-18 MED ORDER — FLEET ENEMA 7-19 GM/118ML RE ENEM: 1.0000 | ENEMA | RECTAL | Status: DC | PRN

## 2018-06-18 MED ORDER — FENTANYL-BUPIVACAINE-NACL 0.5-0.125-0.9 MG/250ML-% EP SOLN
12.0000 mL/h | EPIDURAL | Status: DC | PRN
  Filled 2018-06-18: qty 250

## 2018-06-18 MED ORDER — IBUPROFEN 600 MG PO TABS
600.0000 mg | ORAL_TABLET | Freq: Four times a day (QID) | ORAL | Status: DC
  Administered 2018-06-19 – 2018-06-20 (×7): 600 mg via ORAL
  Filled 2018-06-18 (×6): qty 1

## 2018-06-18 MED ORDER — LABETALOL HCL 5 MG/ML IV SOLN: 80.0000 mg | INTRAVENOUS | Status: DC | PRN

## 2018-06-18 MED ORDER — EPHEDRINE 5 MG/ML INJ: 10.0000 mg | INTRAVENOUS | Status: DC | PRN

## 2018-06-18 MED ORDER — PHENYLEPHRINE 40 MCG/ML (10ML) SYRINGE FOR IV PUSH (FOR BLOOD PRESSURE SUPPORT)
80.0000 ug | PREFILLED_SYRINGE | INTRAVENOUS | Status: DC | PRN
  Filled 2018-06-18: qty 10

## 2018-06-18 MED ORDER — ONDANSETRON HCL 4 MG/2ML IJ SOLN: 4.0000 mg | Freq: Four times a day (QID) | INTRAMUSCULAR | Status: DC | PRN

## 2018-06-18 MED ORDER — LABETALOL HCL 5 MG/ML IV SOLN: 40.0000 mg | INTRAVENOUS | Status: DC | PRN

## 2018-06-18 MED ORDER — DIPHENHYDRAMINE HCL 50 MG/ML IJ SOLN: 12.5000 mg | INTRAMUSCULAR | Status: DC | PRN

## 2018-06-18 MED ORDER — MEASLES, MUMPS & RUBELLA VAC IJ SOLR: 0.5000 mL | Freq: Once | INTRAMUSCULAR | Status: DC

## 2018-06-18 MED ORDER — OXYTOCIN 40 UNITS IN NORMAL SALINE INFUSION - SIMPLE MED
1.0000 m[IU]/min | INTRAVENOUS | Status: DC
  Administered 2018-06-18: 2 m[IU]/min via INTRAVENOUS
  Filled 2018-06-18: qty 1000

## 2018-06-18 MED ORDER — PRENATAL MULTIVITAMIN CH
1.0000 | ORAL_TABLET | Freq: Every day | ORAL | Status: DC
  Administered 2018-06-19 – 2018-06-20 (×2): 1 via ORAL
  Filled 2018-06-18: qty 1

## 2018-06-18 MED ORDER — HYDRALAZINE HCL 20 MG/ML IJ SOLN: 10.0000 mg | INTRAMUSCULAR | Status: DC | PRN

## 2018-06-18 MED ORDER — FENTANYL-BUPIVACAINE-NACL 0.5-0.125-0.9 MG/250ML-% EP SOLN: 12.0000 mL/h | EPIDURAL | Status: DC | PRN

## 2018-06-18 MED ORDER — TETANUS-DIPHTH-ACELL PERTUSSIS 5-2.5-18.5 LF-MCG/0.5 IM SUSP: 0.5000 mL | Freq: Once | INTRAMUSCULAR | Status: DC

## 2018-06-18 MED ORDER — ONDANSETRON HCL 4 MG/2ML IJ SOLN: 4.0000 mg | INTRAMUSCULAR | Status: DC | PRN

## 2018-06-18 MED ORDER — DIBUCAINE 1 % RE OINT: 1.0000 "application " | TOPICAL_OINTMENT | RECTAL | Status: DC | PRN

## 2018-06-18 MED ORDER — HYDROXYZINE HCL 50 MG PO TABS: 50.0000 mg | ORAL_TABLET | Freq: Four times a day (QID) | ORAL | Status: DC | PRN

## 2018-06-18 MED ORDER — OXYCODONE-ACETAMINOPHEN 5-325 MG PO TABS: 2.0000 | ORAL_TABLET | ORAL | Status: DC | PRN

## 2018-06-18 MED ORDER — OXYTOCIN BOLUS FROM INFUSION
500.0000 mL | Freq: Once | INTRAVENOUS | Status: AC
  Administered 2018-06-18: 500 mL via INTRAVENOUS

## 2018-06-18 MED ORDER — ACETAMINOPHEN 325 MG PO TABS
650.0000 mg | ORAL_TABLET | ORAL | Status: DC | PRN
  Administered 2018-06-18 – 2018-06-20 (×5): 650 mg via ORAL
  Filled 2018-06-18 (×5): qty 2

## 2018-06-18 MED ORDER — SIMETHICONE 80 MG PO CHEW: 80.0000 mg | CHEWABLE_TABLET | ORAL | Status: DC | PRN

## 2018-06-18 MED ORDER — ZOLPIDEM TARTRATE 5 MG PO TABS: 5.0000 mg | ORAL_TABLET | Freq: Every evening | ORAL | Status: DC | PRN

## 2018-06-18 MED ORDER — OXYTOCIN 40 UNITS IN NORMAL SALINE INFUSION - SIMPLE MED
2.5000 [IU]/h | INTRAVENOUS | Status: DC
  Administered 2018-06-18: 2.5 [IU]/h via INTRAVENOUS

## 2018-06-18 MED ORDER — LACTATED RINGERS IV SOLN: 500.0000 mL | INTRAVENOUS | Status: DC | PRN

## 2018-06-18 MED ORDER — BUPIVACAINE HCL (PF) 0.25 % IJ SOLN
INTRAMUSCULAR | Status: DC | PRN
  Administered 2018-06-18: 8 mL/h via EPIDURAL

## 2018-06-18 MED ORDER — SODIUM CHLORIDE (PF) 0.9 % IJ SOLN
INTRAMUSCULAR | Status: DC | PRN
  Administered 2018-06-18: 12 mL/h via EPIDURAL

## 2018-06-18 NOTE — Anesthesia Procedure Notes (Addendum)
Epidural Patient location during procedure: OB Start time: 06/18/2018 7:58 AM End time: 06/18/2018 8:17 AM  Staffing Anesthesiologist: Trevor Iha, MD Performed: anesthesiologist   Preanesthetic Checklist Completed: patient identified, site marked, surgical consent, pre-op evaluation, timeout performed, IV checked, risks and benefits discussed and monitors and equipment checked  Epidural Patient position: sitting Prep: site prepped and draped and DuraPrep Patient monitoring: continuous pulse ox and blood pressure Approach: midline Location: L3-L4 Injection technique: LOR air  Needle:  Needle type: Tuohy  Needle gauge: 17 G Needle length: 9 cm and 9 Needle insertion depth: 7 cm Catheter type: closed end flexible Catheter size: 19 Gauge Catheter at skin depth: 12 cm Test dose: negative  Assessment Events: blood not aspirated, injection not painful, no injection resistance, negative IV test and no paresthesia  Additional Notes Patient identified. Risks/Benefits/Options discussed with patient including but not limited to bleeding, infection, nerve damage, paralysis, failed block, incomplete pain control, headache, blood pressure changes, nausea, vomiting, reactions to medication both or allergic, itching and postpartum back pain. Confirmed with bedside nurse the patient's most recent platelet count. Confirmed with patient that they are not currently taking any anticoagulation, have any bleeding history or any family history of bleeding disorders. Patient expressed understanding and wished to proceed. All questions were answered. Sterile technique was used throughout the entire procedure. Please see nursing notes for vital signs. Test dose was given through epidural needle and negative prior to continuing to dose epidural or start infusion. Warning signs of high block given to the patient including shortness of breath, tingling/numbness in hands, complete motor block, or any  concerning symptoms with instructions to call for help. Patient was given instructions on fall risk and not to get out of bed. All questions and concerns addressed with instructions to call with any issues. 1 Attempt (S) . Patient tolerated procedure well.

## 2018-06-18 NOTE — Progress Notes (Signed)
Patient ID: Lisa Hall, female   DOB: 1986/01/11, 33 y.o.   MRN: [id number] Lisa Hall is a 33 y.o. G3P1011 at [redacted]w[redacted]d admitted for early labor, GHTN, A1DM  Subjective: Uncomfortable, has gotten 3 doses IV pain meds  Objective: BP (!) 145/99 (BP Location: Right Arm)   Pulse 91   Temp 98.1 F (36.7 C) (Oral)   Resp 18   Wt 81.4 kg   LMP 09/10/2017 (Exact Date)   BMI 32.81 kg/m  No intake/output data recorded.  FHT:  FHR: 130 bpm, variability: moderate,  accelerations:  Present,  decelerations:  Absent UC:   q 2-66mins  SVE:   Dilation: 5.5 Effacement (%): 70 Station: -2 Exam by:: [employer] RN  Labs: Lab Results  Component Value Date   WBC 14.3 (H) 06/18/2018   HGB 10.3 (L) 06/18/2018   HCT 33.5 (L) 06/18/2018   MCV 82.5 06/18/2018   PLT 303 06/18/2018   CBG (last 3)  Recent Labs    06/18/18 0226  GLUCAP 84     Assessment / Plan: Spontaneous labor, progressing normally  Labor: Progressing normally Fetal Wellbeing:  Category I Pain Control:  IV pain meds Pre-eclampsia: bp's stable, labs normal I/D:  pcr neg Anticipated MOD:  NSVD  Cheral Marker CNM, WHNP-BC 06/18/2018, 5:51 AM

## 2018-06-18 NOTE — Anesthesia Preprocedure Evaluation (Addendum)
Anesthesia Evaluation  Patient identified by MRN, date of birth, ID band Patient awake    Reviewed: Allergy & Precautions, NPO status , Patient's Chart, lab work & pertinent test results  Airway Mallampati: II  TM Distance: >3 FB Neck ROM: Full    Dental no notable dental hx. (+) Teeth Intact   Pulmonary Current Smoker,    Pulmonary exam normal breath sounds clear to auscultation       Cardiovascular hypertension, Pt. on medications and Pt. on home beta blockers Normal cardiovascular exam Rhythm:Regular Rate:Normal     Neuro/Psych negative neurological ROS  negative psych ROS   GI/Hepatic   Endo/Other  diabetes, Gestational  Renal/GU      Musculoskeletal   Abdominal   Peds  Hematology  (+) anemia , Plts 303 Hgb 10.3   Anesthesia Other Findings   Reproductive/Obstetrics (+) Pregnancy                            Anesthesia Physical Anesthesia Plan  ASA: III  Anesthesia Plan: Epidural   Post-op Pain Management:    Induction:   PONV Risk Score and Plan:   Airway Management Planned:   Additional Equipment:   Intra-op Plan:   Post-operative Plan:   Informed Consent: I have reviewed the patients History and Physical, chart, labs and discussed the procedure including the risks, benefits and alternatives for the proposed anesthesia with the patient or authorized representative who has indicated his/her understanding and acceptance.       Plan Discussed with:   Anesthesia Plan Comments: (69F w G HTN on Labetalol and GDM for LE)        Anesthesia Quick Evaluation

## 2018-06-18 NOTE — Progress Notes (Deleted)
Patient: Lisa Hall MRN: [id number]  GBS status: Negative, IAP given: None   Patient is a 33 y.o. now G2P2 s/p NSVD at [redacted]w[redacted]d, who was admitted for SOL. AROM 2h 57m prior to delivery with clear fluid. Pregnancy complicated by gHTN and GDM.    Delivery Note At 6:04 PM a viable female was delivered via Vaginal, Spontaneous (Presentation: OA).  APGAR: 9, 9; weight pending.   Placenta status: spontaneous, intact.  Cord: 3 vessel with the following complications: loose nuchal x1.   Anesthesia:  Epidural  Episiotomy: None Lacerations: 2nd degree Suture Repair: 3.0 vicryl rapide Est. Blood Loss (mL):  252   Head delivered OA. Compound right arm noted. Shoulder and body delivered in usual fashion. Loose nuchal x1 noted after delivery and easily reduced. Infant with spontaneous cry, placed on mother's abdomen, dried and bulb suctioned. Cord clamped x 2 after 1-minute delay, and cut by family member. Cord blood drawn. Placenta delivered spontaneously with gentle cord traction. Fundus firm with massage and Pitocin. Perineum inspected and found to have 2nd degree laceration, which was repaired with 3.0 vicryl rapide with good hemostasis achieved.  Mom to postpartum.  Baby to Couplet care / Skin to Skin.  De Hollingshead 06/18/2018, 6:42 PM

## 2018-06-18 NOTE — H&P (Addendum)
LABOR AND DELIVERY ADMISSION HISTORY AND PHYSICAL NOTE  Lisa Hall is a 33 y.o. female G28P1011 with IUP at [redacted]w[redacted]d by LMP presenting for concern for labor with stronger contractions. While in the MAU she was found to have high blood pressure and to be in latent labor. She has a history of gHTN, A1DM this pregnancy. History of Pre-E in previous pregnancy. Denies headache requiring medication, vision changes, and right upper quadrant pain.  She reports positive fetal movement. She denies leakage of fluid or vaginal bleeding.  Prenatal History/Complications: PNC at [facility] Pregnancy complications:  - gHTN - History of PreE - A1DM  Past Medical History: Past Medical History:  Diagnosis Date  . Preeclampsia     Past Surgical History: Past Surgical History:  Procedure Laterality Date  . DILATION AND CURETTAGE OF UTERUS      Obstetrical History: OB History    Gravida  3   Para  1   Term  1   Preterm  0   AB  1   Living  1     SAB  1   TAB  0   Ectopic  0   Multiple  0   Live Births  1           Social History: Social History   Socioeconomic History  . Marital status: Single    Spouse name: Not on file  . Number of children: Not on file  . Years of education: Not on file  . Highest education level: Not on file  Occupational History  . Not on file  Social Needs  . Financial resource strain: Not on file  . Food insecurity:    Worry: Not on file    Inability: Not on file  . Transportation needs:    Medical: Not on file    Non-medical: Not on file  Tobacco Use  . Smoking status: Current Every Day Smoker    Packs/day: 0.25    Types: Cigarettes  . Smokeless tobacco: Never Used  Substance and Sexual Activity  . Alcohol use: Never    Frequency: Never  . Drug use: Never  . Sexual activity: Not Currently    Birth control/protection: None  Lifestyle  . Physical activity:    Days per week: Not on file    Minutes per session: Not on file  . Stress: Not on  file  Relationships  . Social connections:    Talks on phone: Not on file    Gets together: Not on file    Attends religious service: Not on file    Active member of club or organization: Not on file    Attends meetings of clubs or organizations: Not on file    Relationship status: Not on file  Other Topics Concern  . Not on file  Social History Narrative  . Not on file    Family History: Family History  Problem Relation Age of Onset  . Hypertension Mother   . Diabetes Mother     Allergies: No Known Allergies  Medications Prior to Admission  Medication Sig Dispense Refill Last Dose  . aspirin EC 81 MG tablet Take 1 tablet (81 mg total) by mouth daily. Take after 12 weeks for prevention of preeclampsia later in pregnancy 300 tablet 2 06/16/2018 at Unknown time  . famotidine (PEPCID) 20 MG tablet Take 1 tablet (20 mg total) by mouth 2 (two) times daily. 60 tablet 3 Past Month at Unknown time  . ondansetron (ZOFRAN ODT) 4 MG  disintegrating tablet Take 1 tablet (4 mg total) by mouth every 6 (six) hours as needed for nausea. 20 tablet 0 Past Month at Unknown time  . Prenatal MV-Min-FA-Omega-3 (PRENATAL GUMMIES/DHA & FA) 0.4-32.5 MG CHEW Chew by mouth.   06/16/2018 at Unknown time     Review of Systems  All systems reviewed and negative except as stated in HPI  Physical Exam Blood pressure (!) 143/98, pulse (!) 120, temperature 98.3 F (36.8 C), temperature source Oral, resp. rate 18, weight 81.4 kg, last menstrual period 09/10/2017. General appearance: alert, cooperative and no distress Lungs: normal work of breathing Abdomen: soft, non-tender; bowel sounds normal Extremities: No calf swelling or tenderness Presentation: cephalic Fetal monitoring: Cat 1 Uterine activity: Every 4-5 minutes, irregular Dilation: 4.5 Effacement (%): 70, 80 Station: -2 Exam by:: Cisco RN  Prenatal labs: ABO, Rh: A/Positive/-- (10/21 1016) Antibody: Negative (10/21 1016) Rubella:  2.45 (10/21 1016) RPR: Non Reactive (01/09 1032)  HBsAg: Negative (10/21 1016)  HIV: Non Reactive (01/09 1032)  GC/Chlamydia: Negative GBS:   unknown, PCR pending 1 hr Glucola: 185, 2 hr 98 Genetic screening: Normal Anatomy US: normal, no fetal anomalies seen  Prenatal Transfer Tool  Maternal Diabetes: Yes:  Diabetes Type:  Diet controlled Genetic Screening: Normal Maternal Ultrasounds/Referrals: Normal Fetal Ultrasounds or other Referrals:  None Maternal Substance Abuse:  No Significant Maternal Medications:  Meds include: Other: Famotidine, ASA, Zofran Significant Maternal Lab Results: None  No results found for this or any previous visit (from the past 24 hour(s)).  Patient Active Problem List   Diagnosis Date Noted  . Abnormal glucose tolerance in pregnancy 05/06/2018  . Supervision of high risk pregnancy, antepartum 02/10/2018  . History of pre-eclampsia 02/10/2018  . Placenta previa antepartum in first trimester 02/10/2018    Assessment: Lisa Hall is a 33 y.o. G3P1011 at [redacted]w[redacted]d here for SOL with gHTN with history of Pre-E, and A1DM.  #Labor: expectant management #Pain: Analgesic/anethesia prn; epidural upon request #FWB: Cat 1 #gHTN: Vitals per floor, Follow up CMP and Urine Protein/Creatnine Ratio, Labetalol protocol if pressures are severe #ID: GBS unknown, PCR pending #MOF: breast #MOC: partner vasectomy #Circ: N/a, having a girl  Arlyce Harman, DO [facility], PGY-2 06/18/2018, 1:02 AM   I spoke with and examined patient and agree with resident/PA-S/MS/SNM's note and plan of care.  Cheral Marker, CNM, [facility] 06/18/2018 9:01 AM

## 2018-06-18 NOTE — Progress Notes (Signed)
Lisa Hall is a 33 y.o. G3P1011 at [redacted]w[redacted]d by LMP admitted for early latent labor, gHTN and A1DM.  Subjective: Epidural placed at 7:58 AM.  Patient notes that pain has improved significantly, but reports some continued discomfort with contractions. She just finished eating broth.    Objective: BP (!) 136/97   Pulse 96   Temp 98.3 F (36.8 C) (Oral)   Resp 16   Ht 5\' 2"  (1.575 m)   Wt 81.4 kg   LMP 09/10/2017 (Exact Date)   SpO2 98%   BMI 32.82 kg/m  No intake/output data recorded. No intake/output data recorded.  FHT:  FHR: 125 bpm, variability: moderate,  accelerations:  Present,  decelerations:  Absent UC:   regular, every 3-4 minutes SVE:   Dilation: 5.5 Effacement (%): 80 Station: -2 Exam by:: Lorn Junes, RN  Labs: Lab Results  Component Value Date   WBC 14.3 (H) 06/18/2018   HGB 10.3 (L) 06/18/2018   HCT 33.5 (L) 06/18/2018   MCV 82.5 06/18/2018   PLT 303 06/18/2018    Assessment / Plan: Spontaneous labor, progressing normally  Labor: Progressing normally, continue to use position adjustments for progression of labor. Preeclampsia:  no signs or symptoms of toxicity and labs stable Fetal Wellbeing:  Category I Pain Control:  Epidural I/D:  GBS negtative Anticipated MOD:  NSVD  GDM:  Capillary glucose 71 mg/dL at 0:25 AM.  Will continue to monitor Q4hrs  Francene Boyers 06/18/2018, 10:04 AM

## 2018-06-18 NOTE — Progress Notes (Signed)
Lisa Hall is a 33 y.o. G3P1011 at [redacted]w[redacted]d by LMP admitted for latent labor, gHTN and gDM class A1.  Subjective: Patient is progressing well.  She is now experiencing a significant amount of pressure and would like to begin to push.   Objective: BP (!) 139/102   Pulse (!) 111   Temp 98.7 F (37.1 C) (Oral)   Resp 20   Ht 5\' 2"  (1.575 m)   Wt 81.4 kg   LMP 09/10/2017 (Exact Date)   SpO2 97%   BMI 32.82 kg/m  No intake/output data recorded. No intake/output data recorded.  FHT:  FHR: 135 bpm, variability: moderate,  accelerations:  Present,  decelerations:  Absent UC:   regular, every 2-3 minutes, MVU: 170 SVE:   Dilation: Lip/rim Effacement (%): 100 Station: 0, Plus 1 Exam by:: Lorn Junes, RN  Labs: Lab Results  Component Value Date   WBC 14.3 (H) 06/18/2018   HGB 10.3 (L) 06/18/2018   HCT 33.5 (L) 06/18/2018   MCV 82.5 06/18/2018   PLT 303 06/18/2018    Assessment / Plan: Labor continues to progress.    Labor: Progressing, begin pushing and preparation for delivery Preeclampsia:  no signs or symptoms of toxicity and labs stable Fetal Wellbeing:  Category I Pain Control:  Epidural I/D:  GBS - Anticipated MOD:  NSVD Gestational DM: Capillary glucose 77 mg/dL at 76:19.  Will continue to follow Francene Boyers 06/18/2018, 5:59 PM

## 2018-06-18 NOTE — Anesthesia Pain Management Evaluation Note (Signed)
  CRNA Pain Management Visit Note  Patient: Lisa Hall, 32 y.o., female  "Hello I am a member of the anesthesia team at [facility] and [facility]. We have an anesthesia team available at all times to provide care throughout the hospital, including epidural management and anesthesia for C-section. I don't know your plan for the delivery whether it a natural birth, water birth, IV sedation, nitrous supplementation, doula or epidural, but we want to meet your pain goals."   1.Was your pain managed to your expectations on prior hospitalizations?   Yes   2.What is your expectation for pain management during this hospitalization?     Epidural  3.How can we help you reach that goal? Nursing support  Record the patient's initial score and the patient's pain goal.   Pain: 9/10  Pain Goal: 0/10 [facility] wants you to be able to say your pain was always managed very well.  Lisa Hall 06/18/2018

## 2018-06-19 LAB — GLUCOSE, CAPILLARY: Glucose-Capillary: 78 mg/dL (ref 70–99)

## 2018-06-19 MED ORDER — PNEUMOCOCCAL VAC POLYVALENT 25 MCG/0.5ML IJ INJ
0.5000 mL | INJECTION | INTRAMUSCULAR | Status: AC
  Administered 2018-06-20: 0.5 mL via INTRAMUSCULAR
  Filled 2018-06-19: qty 0.5

## 2018-06-19 NOTE — Progress Notes (Signed)
POSTPARTUM PROGRESS NOTE  Post Partum Day 1 Subjective:  Lisa Hall is a 33 y.o. [id number] [redacted]w[redacted]d s/p SVD with Hx of gHTN, A1DM, and Pre-E in previous pregnancy.  No acute events overnight.  Pt denies problems with ambulating, voiding or po intake.  She denies nausea or vomiting.  Pain is well controlled.  She has had flatus. She has not had bowel movement.  Lochia Small.   Objective: Blood pressure (!) 129/94, pulse 97, temperature 98.2 F (36.8 C), temperature source Oral, resp. rate 16, height 5\' 2"  (1.575 m), weight 81.4 kg, last menstrual period 09/10/2017, SpO2 99 %, unknown if currently breastfeeding.  Physical Exam:  General: alert, cooperative and no distress Lochia: normal flow Chest: no respiratory distress Heart:regular rate, distal pulses intact Abdomen: soft, nontender  Uterine Fundus: firm, appropriately tender, below the umbilicus DVT Evaluation: No calf swelling or tenderness Extremities: no edema  Recent Labs    06/18/18 0122  HGB 10.3*  HCT 33.5*    Assessment/Plan:  ASSESSMENT: Lisa Hall is a 33 y.o. [id number] [redacted]w[redacted]d s/p SVD with gHTN and A1DM this pregnancy and history of Pre-E in previous pregnancy.  Plan for discharge tomorrow   LOS: 1 day   Arlyce Harman, DO [facility], PGY-2 06/19/2018, 7:04 AM

## 2018-06-19 NOTE — Lactation Note (Signed)
This note was copied from a baby's chart. Lactation Consultation Note  Patient Name: Lisa Hall Today's Date: 06/19/2018 Reason for consult: Initial assessment;Term Lisa Hall is 5 hours old.  Mom states first baby had medical problems initially so slow start with breastfeeding.  She breastfed for 6 months and plans to breastfeed new baby for one year.  She is excited baby is latching easily and feeding frequently.  Instructed to feed with any feeding cue using breast massage during feeding.  Reviewed basics.  Mom denies questions or concerns.  She plans on purchasing a Medela DEBP for home use.  Encouraged to call for assist/concerns.  Lactation services and support information given and reviewed.  Maternal Data Does the patient have breastfeeding experience prior to this delivery?: Yes  Feeding    LATCH Score                   Interventions    Lactation Tools Discussed/Used     Consult Status Consult Status: Follow-up Date: 06/20/18 Follow-up type: In-patient    Lisa Hall 06/19/2018, 2:45 PM

## 2018-06-19 NOTE — Anesthesia Postprocedure Evaluation (Signed)
Anesthesia Post Note  Patient: [employer]  Procedure(s) Performed: AN AD HOC LABOR EPIDURAL     Patient location during evaluation: Mother Baby Anesthesia Type: Epidural Level of consciousness: awake Pain management: satisfactory to patient Vital Signs Assessment: post-procedure vital signs reviewed and stable Respiratory status: spontaneous breathing Cardiovascular status: stable Anesthetic complications: no    Last Vitals:  Vitals:   06/19/18 0143 06/19/18 0526  BP: 125/90 (!) 129/94  Pulse: (!) 102 97  Resp: 16 16  Temp: 36.8 C 36.8 C  SpO2:  99%    Last Pain:  Vitals:   06/19/18 0526  TempSrc: Oral  PainSc: 0-No pain   Pain Goal: Patients Stated Pain Goal: 0 (06/17/18 2320)                 Cephus Shelling

## 2018-06-19 NOTE — Progress Notes (Signed)
Post Partum Day 1 Subjective: no complaints, up ad lib, voiding and tolerating PO  Objective: Blood pressure (!) 133/99, pulse 93, temperature 98.4 F (36.9 C), temperature source Oral, resp. rate 16, height 5\' 2"  (1.575 m), weight 81.4 kg, last menstrual period 09/10/2017, SpO2 98 %, unknown if currently breastfeeding.  Physical Exam:  General: alert, cooperative and no distress Lochia: appropriate Uterine Fundus: firm Incision: n/a DVT Evaluation: No evidence of DVT seen on physical exam.  Recent Labs    06/18/18 0122  HGB 10.3*  HCT 33.5*    Assessment/Plan: Plan for discharge tomorrow, Breastfeeding, Social Work consult and Contraception vasectomy   LOS: 1 day   Sharen Counter 06/19/2018, 7:59 PM

## 2018-06-19 NOTE — Progress Notes (Deleted)
Post Partum Day 1 Subjective: no complaints, up ad lib, voiding and tolerating PO  Objective: Blood pressure (!) 133/99, pulse 93, temperature 98.4 F (36.9 C), temperature source Oral, resp. rate 16, height 5\' 2"  (1.575 m), weight 81.4 kg, last menstrual period 09/10/2017, SpO2 98 %, unknown if currently breastfeeding.  Physical Exam:  General: alert, cooperative and no distress Lochia: appropriate Uterine Fundus: firm Incision: n/a DVT Evaluation: No evidence of DVT seen on physical exam.  Recent Labs    06/18/18 0122  HGB 10.3*  HCT 33.5*    Assessment/Plan: Plan for discharge tomorrow, Breastfeeding, Social Work consult and Contraception vasectomy   LOS: 1 day   Sharen Counter 06/19/2018, 5:47 PM

## 2018-06-20 MED ORDER — SENNOSIDES-DOCUSATE SODIUM 8.6-50 MG PO TABS: 2.0000 | ORAL_TABLET | Freq: Every evening | ORAL | 0 refills | Status: DC | PRN

## 2018-06-20 MED ORDER — ASPIRIN EC 81 MG PO TBEC: 81.0000 mg | DELAYED_RELEASE_TABLET | Freq: Every day | ORAL | Status: DC

## 2018-06-20 MED ORDER — ENALAPRIL MALEATE 5 MG PO TABS: 5.0000 mg | ORAL_TABLET | Freq: Every day | ORAL | 0 refills | Status: DC

## 2018-06-20 MED ORDER — BENZOCAINE-MENTHOL 20-0.5 % EX AERO: 1.0000 "application " | INHALATION_SPRAY | CUTANEOUS | 0 refills | Status: DC | PRN

## 2018-06-20 MED ORDER — WITCH HAZEL-GLYCERIN EX PADS: 1.0000 "application " | MEDICATED_PAD | CUTANEOUS | 12 refills | Status: DC | PRN

## 2018-06-20 MED ORDER — IBUPROFEN 800 MG PO TABS: 800.0000 mg | ORAL_TABLET | Freq: Three times a day (TID) | ORAL | 0 refills | Status: DC

## 2018-06-20 MED ORDER — ENALAPRIL MALEATE 5 MG PO TABS
5.0000 mg | ORAL_TABLET | Freq: Every day | ORAL | Status: DC
  Administered 2018-06-20: 5 mg via ORAL
  Filled 2018-06-20 (×2): qty 1

## 2018-06-20 NOTE — Discharge Summary (Addendum)
Postpartum Discharge Summary     Patient Name: Lisa Hall DOB: February 18, 1986 MRN: [id number]  Date of admission: 06/17/2018 Delivering Provider: Arvilla MarketWALLACE, CATHERINE Hall   Date of discharge: 06/20/2018  Admitting diagnosis: 40 WKS, CTX, BLEEDING Intrauterine pregnancy: 6243w1d     Secondary diagnosis:  Active Problems:   Gestational hypertension  Additional problems: Postpartum Hypertension, A1GDM, limited [facility]     Discharge diagnosis: Term Pregnancy Delivered, Gestational Hypertension and GDM A1                                                                                                Post partum procedures:None  Augmentation: AROM  Complications: None  Hospital course:  Onset of Labor With Vaginal Delivery     33 y.o. yo [id number] at 6343w1d was admitted in Latent Labor on 06/17/2018. Patient had an uncomplicated labor course as follows:  Membrane Rupture Time/Date: 3:09 PM ,06/18/2018   Intrapartum Procedures: Episiotomy: None [1]                                         Lacerations:  2nd degree [3]  Patient had a delivery of a Viable infant. 06/18/2018  Information for the patient's newborn:  Lisa Hall [[id number]][[id number]]  Delivery Method: Vaginal, Spontaneous(Filed from Delivery Summary)    Pateint had an uncomplicated postpartum course.  She is ambulating, tolerating a regular diet, passing flatus, and urinating well. Patient is discharged home in stable condition on 06/20/18.   Magnesium Sulfate recieved: No BMZ received: No  Physical exam  Vitals:   06/19/18 1531 06/19/18 2205 06/19/18 2206 06/20/18 0624  BP: (!) 133/99 (!) 141/101 (!) 146/95 (!) 135/103  Pulse: 93 78 77 78  Resp: 16   14  Temp: 98.4 F (36.9 C) 98.6 F (37 C)  98.1 F (36.7 C)  TempSrc: Oral Oral  Oral  SpO2: 98% 100%    Weight:      Height:       General: alert, cooperative and no distress Lochia: appropriate Uterine Fundus: firm Incision: N/A DVT Evaluation: No evidence of DVT seen on  physical exam. No cords or calf tenderness. No significant calf/ankle edema. Labs: Lab Results  Component Value Date   WBC 14.3 (H) 06/18/2018   HGB 10.3 (L) 06/18/2018   HCT 33.5 (L) 06/18/2018   MCV 82.5 06/18/2018   PLT 303 06/18/2018   CMP Latest Ref Rng & Units 06/18/2018  Glucose 70 - 99 mg/dL 78  BUN 6 - 20 mg/dL 8  Creatinine 4.690.44 - 6.291.00 mg/dL 5.280.64  Sodium 413135 - 244145 mmol/L 136  Potassium 3.5 - 5.1 mmol/L 4.0  Chloride 98 - 111 mmol/L 106  CO2 22 - 32 mmol/L 21(L)  Calcium 8.9 - 10.3 mg/dL 9.3  Total Protein 6.5 - 8.1 g/dL 6.0(L)  Total Bilirubin 0.3 - 1.2 mg/dL 0.7  Alkaline Phos 38 - 126 U/L 221(H)  AST 15 - 41 U/L 15  ALT 0 - 44 U/L 10    Discharge instruction: per  After Visit Summary and "Baby and Me Booklet".  After visit meds:  Allergies as of 06/20/2018   No Known Allergies     Medication List    STOP taking these medications   ondansetron 4 MG disintegrating tablet Commonly known as:  ZOFRAN ODT     TAKE these medications   aspirin EC 81 MG tablet Take 1 tablet (81 mg total) by mouth daily. Take for 6 weeks postpartum. What changed:  additional instructions   calcium carbonate 500 MG chewable tablet Commonly known as:  TUMS - dosed in mg elemental calcium Chew 1 tablet by mouth daily.   diphenhydrAMINE 25 mg capsule Commonly known as:  BENADRYL Take 25 mg by mouth every 6 (six) hours as needed.   enalapril 5 MG tablet Commonly known as:  VASOTEC Take 1 tablet (5 mg total) by mouth daily.   famotidine 20 MG tablet Commonly known as:  PEPCID Take 1 tablet (20 mg total) by mouth 2 (two) times daily.   ibuprofen 800 MG tablet Commonly known as:  ADVIL,MOTRIN Take 1 tablet (800 mg total) by mouth 3 (three) times daily.   PRENATAL GUMMIES/DHA & FA 0.4-32.5 MG Chew Chew by mouth.   senna-docusate 8.6-50 MG tablet Commonly known as:  Senokot-S Take 2 tablets by mouth at bedtime as needed for mild constipation.       Diet: routine  diet  Activity: Advance as tolerated. Pelvic rest for 6 weeks.   Outpatient follow up:2 weeks for blood pressure check and 4-6 weeks for post partum visit and 1-2hr GTT Follow up Appt: Future Appointments  Date Time Provider Department Center  07/03/2018 10:00 AM [facility] NURSE [facility] WOC  07/31/2018  8:15 AM Rasch, Harolyn Rutherford, NP [facility]OC  07/31/2018  8:50 AM [facility] LAB [facility] WOC   Follow up Visit: Follow-up Information    Center for [facility]. Go on 07/03/2018.   Specialty:  Obstetrics and Gynecology Why:  for nurses visit for blood pressure check Contact information: [address] [place] [address] [phone]       Rasch, Victorino Dike I, NP. Go on 07/31/2018.   Specialty:  Obstetrics and Gynecology Why:  at 8:15 AM for postpartum visit and 2 hour glucose tolerance test Contact information: [address] [place] [address] [phone]          Please schedule this patient for Postpartum visit in: 4 weeks with the following provider: Any provider For C/S patients schedule nurse incision check in weeks 2 weeks: no High risk pregnancy complicated by: GDM, gHTN, history of preeclampsia Delivery mode:  SVD Anticipated Birth Control:  Significant other to get a vasectomy PP Procedures needed: 2 hour GTT and Blood pressure check Schedule Integrated BH visit: no  Newborn Data: Live born female  Birth Weight: 7 lb 3.3 oz (3269 g) APGAR: 9, 9  Newborn Delivery   Birth date/time:  06/18/2018 18:04:00 Delivery type:  Vaginal, Spontaneous     Baby Feeding: Breast Disposition:home with mother   06/20/2018 Lisa Hall  I confirm that I have verified the information documented in the resident's note and that I have also personally reperformed the history, physical exam and all medical decision making activities of this service and have verified that all service and findings are accurately documented in this  student's note.   Patient plans to schedule postpartum care at [facility]. Per LCSW note, no barriers to discharge  Lisa Cantor, [place] 06/20/2018 12:19 PM  Pt started on enalapril today.  She will have BP check in office early next week.

## 2018-06-20 NOTE — Progress Notes (Signed)
Patient called to discuss plan for blood pressure check early next week. Patient restates that she plans to manage her postpartum care through [facility]. Her baby has an appointment there Monday 06/23/2018 and she's going to inquire about establishing care during that visit. She understands we would recommend she have her blood pressure checked early next week.   Clayton Bibles, CNM 06/20/18  2:50 PM

## 2018-06-20 NOTE — Discharge Instructions (Signed)
Vaginal Delivery, Care After °Refer to this sheet in the next few weeks. These instructions provide you with information about caring for yourself after vaginal delivery. Your health care provider may also give you more specific instructions. Your treatment has been planned according to current medical practices, but problems sometimes occur. Call your health care provider if you have any problems or questions. °What can I expect after the procedure? °After vaginal delivery, it is common to have: °· Some bleeding from your vagina. °· Soreness in your abdomen, your vagina, and the area of skin between your vaginal opening and your anus (perineum). °· Pelvic cramps. °· Fatigue. °Follow these instructions at home: °Medicines °· Take over-the-counter and prescription medicines only as told by your health care provider. °· If you were prescribed an antibiotic medicine, take it as told by your health care provider. Do not stop taking the antibiotic until it is finished. °Driving ° °· Do not drive or operate heavy machinery while taking prescription pain medicine. °· Do not drive for 24 hours if you received a sedative. °Lifestyle °· Do not drink alcohol. This is especially important if you are breastfeeding or taking medicine to relieve pain. °· Do not use tobacco products, including cigarettes, chewing tobacco, or e-cigarettes. If you need help quitting, ask your health care provider. °Eating and drinking °· Drink at least 8 eight-ounce glasses of water every day unless you are told not to by your health care provider. If you choose to breastfeed your baby, you may need to drink more water than this. °· Eat high-fiber foods every day. These foods may help prevent or relieve constipation. High-fiber foods include: °? Whole grain cereals and breads. °? Brown rice. °? Beans. °? Fresh fruits and vegetables. °Activity °· Return to your normal activities as told by your health care provider. Ask your health care provider what  activities are safe for you. °· Rest as much as possible. Try to rest or take a nap when your baby is sleeping. °· Do not lift anything that is heavier than your baby or 10 lb (4.5 kg) until your health care provider says that it is safe. °· Talk with your health care provider about when you can engage in sexual activity. This may depend on your: °? Risk of infection. °? Rate of healing. °? Comfort and desire to engage in sexual activity. °Vaginal Care °· If you have an episiotomy or a vaginal tear, check the area every day for signs of infection. Check for: °? More redness, swelling, or pain. °? More fluid or blood. °? Warmth. °? Pus or a bad smell. °· Do not use tampons or douches until your health care provider says this is safe. °· Watch for any blood clots that may pass from your vagina. These may look like clumps of dark red, brown, or black discharge. °General instructions °· Keep your perineum clean and dry as told by your health care provider. °· Wear loose, comfortable clothing. °· Wipe from front to back when you use the toilet. °· Ask your health care provider if you can shower or take a bath. If you had an episiotomy or a perineal tear during labor and delivery, your health care provider may tell you not to take baths for a certain length of time. °· Wear a bra that supports your breasts and fits you well. °· If possible, have someone help you with household activities and help care for your baby for at least a few days after you   leave the hospital. °· Keep all follow-up visits for you and your baby as told by your health care provider. This is important. °Contact a health care provider if: °· You have: °? Vaginal discharge that has a bad smell. °? Difficulty urinating. °? Pain when urinating. °? A sudden increase or decrease in the frequency of your bowel movements. °? More redness, swelling, or pain around your episiotomy or vaginal tear. °? More fluid or blood coming from your episiotomy or vaginal  tear. °? Pus or a bad smell coming from your episiotomy or vaginal tear. °? A fever. °? A rash. °? Little or no interest in activities you used to enjoy. °? Questions about caring for yourself or your baby. °· Your episiotomy or vaginal tear feels warm to the touch. °· Your episiotomy or vaginal tear is separating or does not appear to be healing. °· Your breasts are painful, hard, or turn red. °· You feel unusually sad or worried. °· You feel nauseous or you vomit. °· You pass large blood clots from your vagina. If you pass a blood clot from your vagina, save it to show to your health care provider. Do not flush blood clots down the toilet without having your health care provider look at them. °· You urinate more than usual. °· You are dizzy or light-headed. °· You have not breastfed at all and you have not had a menstrual period for 12 weeks after delivery. °· You have stopped breastfeeding and you have not had a menstrual period for 12 weeks after you stopped breastfeeding. °Get help right away if: °· You have: °? Pain that does not go away or does not get better with medicine. °? Chest pain. °? Difficulty breathing. °? Blurred vision or spots in your vision. °? Thoughts about hurting yourself or your baby. °· You develop pain in your abdomen or in one of your legs. °· You develop a severe headache. °· You faint. °· You bleed from your vagina so much that you fill two sanitary pads in one hour. °This information is not intended to replace advice given to you by your health care provider. Make sure you discuss any questions you have with your health care provider. °Document Released: 04/06/2000 Document Revised: 09/21/2015 Document Reviewed: 04/24/2015 °Elsevier Interactive Patient Education © 2019 [employer] Inc[employer] ° °

## 2018-06-20 NOTE — Clinical Social Work Maternal (Addendum)
CLINICAL SOCIAL WORK MATERNAL/CHILD NOTE  Patient Details  Name: Lisa Hall MRN: [id number] Date of Birth: 1985-09-09  Date:  06/20/2018  Clinical Social Worker Initiating Note:  Montel Clock Chea Malan Date/Time: Initiated:  06/20/18/      Child's Name:  Lisa Hall   Biological Parents: Marlan Palau, Kristopher Pernell Dupre- father   Need for Interpreter:  None   Reason for Referral:  Late or No Prenatal Care    Address:  [address] [place] [address]    Phone number:  [phone] (home)     Additional phone number:   Household Members/Support Persons (HM/SP):   Household Member/Support Person 3   HM/SP Name Relationship DOB or Age  HM/SP -1   Hospital doctor Chad   mother    HM/SP -2   Kristopher Economist    father    HM/SP -3 Dominic sibling   HM/SP -4        HM/SP -5        HM/SP -6        HM/SP -7        HM/SP -8          Natural Supports (not living in the home):      Professional Supports: None   Employment: Other (comment)(on maternity leave)   Type of Work: Environmental education officer:  Other (comment)(unknown )   Homebound arranged:    Surveyor, quantity Resources:  Medicaid   Other Resources:  Sales executive , WIC(plan to apply)   Cultural/Religious Considerations Which May Impact Care:  none presented.  Strengths:  Ability to meet basic needs , Compliance with medical plan    Psychotropic Medications:         Pediatrician:   Karrie Doffing. [facility] (Pediatrician).   Phone [phone].   Address [address] [place], [place]    Pediatrician List:   [facility]      Pediatrician Fax Number:    Risk Factors/Current Problems:  None(none reported)   Cognitive State:  Alert , Goal Oriented , Insightful , Other (Comment)(MOB expressed being really tired as she only slept for an hour. )   Mood/Affect:  Comfortable , Calm , Happy    CSW Assessment: CSW spoke  with MOB at bedside. CSW began assessment by asking that all parties in the room step out so that CSW can speak with MOB alone. FOB as well as mother in law were all very understanding and left room without hesitation. CSW explained CSW's role in the hospital as well as reason for the visit. CSW began conversation by seeking further details from MOB on limited [facility]. MOB expressed that she did have 2-3 visits with [facility] in [place]. MOB expressed that the delay in [facility] arised when she was waiting to get into [facility]. MOB reported that there was a 2 month waiting period therefore she fell behind in getting [facility]. MOB reports that she did take medications for a toothache while pregnancy but denied taking anything else. CSW advised MOB that Cord Screen has already been sent off to test baby as well as a urine screen would be done in the hospital as hospital protocol to those that have limited PNC. MOB was very understanding and agreeable to this being done as MOB reports that she didn't take anything while pregnant that wasn't prescribed  to her.    MOB reported having supports from FOB as well as dependent son, and other family members. MOB report that her 59 year old son is being cared for by other family members during this time. MOB expressed being well aware of SIDS and PPD. CSW still reviewed information on both SIDS and PPD to ensure that MOB has resources if needed.   MOB report that she works for [employer] a [employer]. She is currently out of work due to pregnancy.  MOB expressed that transportation is not an issue in getting to and from appointments for both self and infant. CSW advised MOB to call and get [facility] established for infant as well as get infant added to Medicaid. MOB agreeable to call and establish further needs for infant. MOB notified CSW that she currently feels that she has everything needed to care for infant and that infant will sleep  in basinet once home. MOB report no SI, HI, or DV since giving birth or before giving birth.    CSW Plan/Description:  Sudden Infant Death Syndrome (SIDS) Education, Hospital Drug Screen Policy Information    Loralie Champagne 06/20/2018, 1:58 PM

## 2018-06-20 NOTE — Progress Notes (Deleted)
CSW received consult for hx of Anxiety and Limited Prenatal Care  CSW met with MOB to offer support and complete assessment.    CSW spoke with MOB at bedside. CSW began assessment by asking that all parties in the room step out so that CSW can speak with MOB alone. FOB as well as mother in law were all very understanding and left room without hesitation. CSW explained CSW's role in the hospital as well as reason for the visit. CSW began conversation by seeking further details from MOB on limited PNC. MOB expressed that she did have 2-3 visits with [facility] in [place]. MOB expressed that the delay in PNC arised when she was waiting to get into [facility]. MOB reported that there was a 2 month waiting period therefore she fell behind in getting PNC. MOB reports that she did take medications for a toothache while pregnancy but denied taking anything else. CSW advised MOB that Cord Screen has already been sent off to test baby as well as a urine screen would be done in the hospital as hospital protocol to those that have limited PNC. MOB was very understanding and agreeable to this being done as MOB reports that she didn't take anything while pregnant that wasn't prescribed to her.    MOB reported having supports from FOB as well as dependent son, and other family members. MOB report that her 8 year old son is being cared for by other family members during this time. MOB expressed being well aware of SIDS and PPD. CSW still reviewed information on both SIDS and PPD to ensure that MOB has resources if needed.   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 She is currently out of work due to pregnancy.  MOB expressed that transportation is not an issue in getting to and from appointments for both self and infant. CSW advised MOB to call and get WIC established for infant as well as get infant added to Medicaid. MOB agreeable to call and establish  further needs for infant. MOB notified CSW that she currently feels that she has everything needed to care for infant and that infant will sleep in basinet once home. MOB report no SI, HI, or DV since giving birth or before giving birth.   CSW provided education regarding the baby blues period vs. perinatal mood disorders, discussed treatment and gave resources for mental health follow up if concerns arise.  CSW recommends self-evaluation during the postpartum time period using the New Mom Checklist from Postpartum Progress and encouraged MOB to contact a medical professional if symptoms are noted at any time.   CSW provided review of Sudden Infant Death Syndrome (SIDS) precautions.   CSW identifies no further need for intervention and no barriers to discharge at this time.     Lisa Hall S. Lisa Hall, MSW, LCSW-[facility] [phone]  

## 2018-06-20 NOTE — Lactation Note (Signed)
This note was copied from a baby's chart. Lactation Consultation Note:  LC arrived in mothers room. Mother very eager to go smoke with FOB. Infant being held by [employer].  Infant began cuing and mother had just reported that last feed was not a good feeding. She reports that infant sucks for a few mins and then falls asleep. Suggested that mother do STS to rouse infant.  Mother very disagreeable with all suggestions that LC gave.   LC suggested that mother feed infant before going to smoke.  Mother placed infant in cross cradle hold.  Infant latched with a few chews at first .  I had mother to do breast compression.  Infant began to have a much deeper latch and more rhythmic suckling.   Discussed treatment and prevention of engorgement.  Mother disagreeable about using ice to decrease swelling.  She reports that she has always been told to use heat to make the milk flow.   Mother was given a harmony hand pump. Instructions on use and storage of milk .  Discussion about using a smoke jacket when going to smoke, due to infant breathing in second hand smoke.   Mother was not receptive to [facility] teaching.  Advised mother to follow up with LC as needed.    Patient Name: Lisa Hall Today's Date: 06/20/2018 Reason for consult: Follow-up assessment   Maternal Data    Feeding Feeding Type: Breast Fed  LATCH Score Latch: Grasps breast easily, tongue down, lips flanged, rhythmical sucking.  Audible Swallowing: A few with stimulation  Type of Nipple: Everted at rest and after stimulation  Comfort (Breast/Nipple): Filling, red/small blisters or bruises, mild/mod discomfort  Hold (Positioning): Assistance needed to correctly position infant at breast and maintain latch.  LATCH Score: 7  Interventions Interventions: Assisted with latch;Hand express;Breast compression;Adjust position;Support pillows;Position options;Expressed milk;Hand pump  Lactation Tools  Discussed/Used Pump Review: Setup, frequency, and cleaning;Milk Storage Initiated by:: Stevan Born RN,IBCLC Date initiated:: 06/20/18   Consult Status Consult Status: Complete Date: 06/20/18 Follow-up type: In-patient    Stevan Born [facility] 06/20/2018, 9:52 AM

## 2018-07-02 ENCOUNTER — Telehealth: Payer: Self-pay

## 2018-07-02 NOTE — Telephone Encounter (Signed)
Pt stated that she delivered two weeks ago and passed a huge clot.  Can someone call her back? Called pt and pt informed me that she had a golf ball size to come out.   I asked pt if that happened again and did her bleeding lighten up.  Pt states that the clot only happened once and that her bleeding did slow down after the clot.  I informed the pt that it is normal that she should see that her bleeding continue to slow down.  I confirmed with pt about her transferring her care.  Pt stated that she does have an appt scheduled next week for her pp however because she had not been seen by them yet they advised her to call our office with her concern.  I advised pt that if she had any other questions to please give [place] a call.  Pt stated thank you

## 2018-07-03 ENCOUNTER — Ambulatory Visit: Payer: Self-pay

## 2018-07-16 ENCOUNTER — Ambulatory Visit: Payer: Self-pay | Admitting: Family Medicine

## 2018-07-31 ENCOUNTER — Ambulatory Visit: Payer: Self-pay | Admitting: Obstetrics and Gynecology

## 2018-07-31 ENCOUNTER — Other Ambulatory Visit: Payer: Self-pay

## 2018-08-27 ENCOUNTER — Ambulatory Visit: Payer: Self-pay | Admitting: Family Medicine

## 2018-09-16 ENCOUNTER — Encounter: Payer: Self-pay | Admitting: Family Medicine

## 2018-09-16 ENCOUNTER — Other Ambulatory Visit: Payer: Self-pay

## 2018-09-16 ENCOUNTER — Ambulatory Visit (INDEPENDENT_AMBULATORY_CARE_PROVIDER_SITE_OTHER): Payer: Medicaid Other | Admitting: Family Medicine

## 2018-09-16 VITALS — BP 119/85 | HR 100 | Temp 98.7°F | Ht 62.0 in | Wt 159.2 lb

## 2018-09-16 DIAGNOSIS — R768 Other specified abnormal immunological findings in serum: Secondary | ICD-10-CM

## 2018-09-16 DIAGNOSIS — E782 Mixed hyperlipidemia: Secondary | ICD-10-CM

## 2018-09-16 DIAGNOSIS — Z6829 Body mass index (BMI) 29.0-29.9, adult: Secondary | ICD-10-CM | POA: Insufficient documentation

## 2018-09-16 DIAGNOSIS — K219 Gastro-esophageal reflux disease without esophagitis: Secondary | ICD-10-CM | POA: Diagnosis not present

## 2018-09-16 DIAGNOSIS — M25562 Pain in left knee: Secondary | ICD-10-CM

## 2018-09-16 DIAGNOSIS — O99810 Abnormal glucose complicating pregnancy: Secondary | ICD-10-CM | POA: Diagnosis not present

## 2018-09-16 DIAGNOSIS — G8929 Other chronic pain: Secondary | ICD-10-CM

## 2018-09-16 DIAGNOSIS — Z7689 Persons encountering health services in other specified circumstances: Secondary | ICD-10-CM

## 2018-09-16 DIAGNOSIS — M25561 Pain in right knee: Secondary | ICD-10-CM

## 2018-09-16 DIAGNOSIS — D649 Anemia, unspecified: Secondary | ICD-10-CM

## 2018-09-16 MED ORDER — PANTOPRAZOLE SODIUM 40 MG PO TBEC: 40.0000 mg | DELAYED_RELEASE_TABLET | Freq: Every day | ORAL | 3 refills | Status: DC

## 2018-09-16 NOTE — Progress Notes (Signed)
Subjective:  Patient ID: Lisa Hall, female    DOB: 1985/11/30, 33 y.o.   MRN: [id number]  Chief Complaint:  New Patient (Initial Visit) and Establish Care   HPI: Lisa Hall is a 33 y.o. female presenting on 09/16/2018 for New Patient (Initial Visit) and Establish Care   1. Encounter to establish care  Pt presents today to establish care. Pt states she has been doing fairly well since the birth of her daughter. States she wants to establish here because her children come to this practice. We will request records. Pt was preeclamptic and on enalapril during pregnancy. Pt states she has not been taking the enalapril for at least 2 months and needs to know if she should restart it. She denies headaches, dizziness, chest pain, leg swelling, or visual changes. She has not checked her blood pressure at home.   2. Gastroesophageal reflux disease without esophagitis  Daily symptoms with water brash. States this was controlled with Tums but the Tums are no longer effective. States she has epigastric and throat burning, foul breath, and belching. States this has been ongoing since her pregnancy and has not improved. She denies sore throat, hemoptysis, voice changes, or dysphagia. She does have a dry cough.    3. Abnormal glucose tolerance in pregnancy  Pt failed her 2 hour GTT. She denies headache, visual changes, diaphoresis, polyuria, polyphagia, or polydipsia. No hypoglycemic or hyperglycemic symptoms.    4. BMI 29.0-29.9,adult  Does try to watch what she eats. Does not exercise on a regular basis due to newborn and knee pain.    5. Anemia, unspecified type  Anemia after birth. Has not had Hbg or Hct rechecked, no iron repletion therapy. No weakness, dizziness, shortness of breath, palpitations, or fatigue. No abnormal bleeding or bruising.    6. Chronic pain of both knees  Ongoing for several months. No injury. Initially thought it was due to the weight gain from pregnancy but the pain has  continued since giving birth. Pt states she has bilateral knee pain. Worse with ambulation and weight bearing. States dull and aching most of the time. She has noticed knee swelling at night.      Relevant past medical, surgical, family, and social history reviewed and updated as indicated.  Allergies and medications reviewed and updated.   Past Medical History:  Diagnosis Date  . Elevated BP without diagnosis of hypertension 06/18/2018  . Preeclampsia     Past Surgical History:  Procedure Laterality Date  . DILATION AND CURETTAGE OF UTERUS      Social History   Socioeconomic History  . Marital status: Single    Spouse name: Not on file  . Number of children: Not on file  . Years of education: Not on file  . Highest education level: Not on file  Occupational History  . Not on file  Social Needs  . Financial resource strain: Not on file  . Food insecurity:    Worry: Not on file    Inability: Not on file  . Transportation needs:    Medical: Not on file    Non-medical: Not on file  Tobacco Use  . Smoking status: Current Every Day Smoker    Packs/day: 0.25    Types: Cigarettes  . Smokeless tobacco: Never Used  Substance and Sexual Activity  . Alcohol use: Never    Frequency: Never  . Drug use: Never  . Sexual activity: Not Currently    Birth control/protection: None  Lifestyle  .  Physical activity:    Days per week: Not on file    Minutes per session: Not on file  . Stress: To some extent  Relationships  . Social connections:    Talks on phone: Not on file    Gets together: Not on file    Attends religious service: Not on file    Active member of club or organization: Not on file    Attends meetings of clubs or organizations: Not on file    Relationship status: Not on file  . Intimate partner violence:    Fear of current or ex partner: Not on file    Emotionally abused: Not on file    Physically abused: Not on file    Forced sexual activity: Not on file   Other Topics Concern  . Not on file  Social History Narrative  . Not on file    Outpatient Encounter Medications as of 09/16/2018  Medication Sig  . calcium carbonate (TUMS - DOSED IN MG ELEMENTAL CALCIUM) 500 MG chewable tablet Chew 1 tablet by mouth daily.  Marland Kitchen ibuprofen (ADVIL,MOTRIN) 800 MG tablet Take 1 tablet (800 mg total) by mouth 3 (three) times daily.  . pantoprazole (PROTONIX) 40 MG tablet Take 1 tablet (40 mg total) by mouth daily.  . [DISCONTINUED] aspirin EC 81 MG tablet Take 1 tablet (81 mg total) by mouth daily. Take for 6 weeks postpartum.  . [DISCONTINUED] benzocaine-Menthol (DERMOPLAST) 20-0.5 % AERO Apply 1 application topically as needed for irritation (perineal discomfort).  . [DISCONTINUED] diphenhydrAMINE (BENADRYL) 25 mg capsule Take 25 mg by mouth every 6 (six) hours as needed.  . [DISCONTINUED] enalapril (VASOTEC) 5 MG tablet Take 1 tablet (5 mg total) by mouth daily. (Patient not taking: Reported on 09/16/2018)  . [DISCONTINUED] famotidine (PEPCID) 20 MG tablet Take 1 tablet (20 mg total) by mouth 2 (two) times daily.  . [DISCONTINUED] Prenatal MV-Min-FA-Omega-3 (PRENATAL GUMMIES/DHA & FA) 0.4-32.5 MG CHEW Chew by mouth.  . [DISCONTINUED] senna-docusate (SENOKOT-S) 8.6-50 MG tablet Take 2 tablets by mouth at bedtime as needed for mild constipation.  . [DISCONTINUED] witch hazel-glycerin (TUCKS) pad Apply 1 application topically as needed for hemorrhoids.   No facility-administered encounter medications on file as of 09/16/2018.     No Known Allergies  Review of Systems  Constitutional: Negative for activity change, appetite change, chills, diaphoresis, fatigue, fever and unexpected weight change.  HENT: Negative for sore throat, tinnitus, trouble swallowing and voice change.   Eyes: Negative for visual disturbance.  Respiratory: Positive for cough. Negative for chest tightness and shortness of breath.   Cardiovascular: Negative for chest pain, palpitations and  leg swelling.  Gastrointestinal: Negative for abdominal distention, abdominal pain, anal bleeding, blood in stool, constipation, diarrhea, nausea, rectal pain and vomiting.       Acid reflux  Endocrine: Negative for cold intolerance, heat intolerance, polydipsia, polyphagia and polyuria.  Genitourinary: Negative for decreased urine volume, difficulty urinating and vaginal bleeding.  Musculoskeletal: Positive for arthralgias and joint swelling. Negative for back pain, gait problem, myalgias, neck pain and neck stiffness.  Skin: Negative for color change and pallor.  Neurological: Negative for dizziness, tremors, seizures, syncope, facial asymmetry, speech difficulty, weakness, light-headedness, numbness and headaches.  Hematological: Does not bruise/bleed easily.  Psychiatric/Behavioral: Negative for confusion, dysphoric mood and sleep disturbance.  All other systems reviewed and are negative.       Objective:  BP 119/85   Pulse 100   Temp 98.7 F (37.1 C) (Oral)   Ht 5'  2" (1.575 m)   Wt 159 lb 3.2 oz (72.2 kg)   BMI 29.12 kg/m    Wt Readings from Last 3 Encounters:  09/16/18 159 lb 3.2 oz (72.2 kg)  06/18/18 179 lb 7.3 oz (81.4 kg)  05/01/18 173 lb (78.5 kg)    Physical Exam Vitals signs and nursing note reviewed.  Constitutional:      General: She is not in acute distress.    Appearance: Normal appearance. She is well-developed, well-groomed and overweight. She is not ill-appearing, toxic-appearing or diaphoretic.  HENT:     Head: Normocephalic and atraumatic.     Jaw: There is normal jaw occlusion.     Right Ear: Hearing, tympanic membrane, ear canal and external ear normal.     Left Ear: Hearing, tympanic membrane, ear canal and external ear normal.     Nose: Nose normal.     Mouth/Throat:     Lips: Pink.     Mouth: Mucous membranes are moist.     Pharynx: Oropharynx is clear. No oropharyngeal exudate or posterior oropharyngeal erythema.  Eyes:     General: Lids are  normal.     Extraocular Movements: Extraocular movements intact.     Conjunctiva/sclera: Conjunctivae normal.     Pupils: Pupils are equal, round, and reactive to light.  Neck:     Musculoskeletal: Normal range of motion and neck supple.     Vascular: No carotid bruit.     Trachea: Trachea and phonation normal.  Cardiovascular:     Rate and Rhythm: Normal rate and regular rhythm.  No extrasystoles are present.    Chest Wall: PMI is not displaced.     Pulses: Normal pulses.     Heart sounds: Normal heart sounds. No murmur. No friction rub. No gallop.   Pulmonary:     Effort: Pulmonary effort is normal. No respiratory distress.     Breath sounds: Normal breath sounds.  Abdominal:     General: Abdomen is protuberant. Bowel sounds are normal. There is no distension or abdominal bruit.     Palpations: Abdomen is soft. There is no hepatomegaly or splenomegaly.     Tenderness: There is no abdominal tenderness. There is no right CVA tenderness or left CVA tenderness.     Hernia: No hernia is present.  Musculoskeletal:     Right hip: Normal.     Left hip: Normal.     Right knee: Normal.     Left knee: Normal.     Right ankle: Normal.     Left ankle: Normal.     Right lower leg: No edema.     Left lower leg: No edema.  Lymphadenopathy:     Cervical: No cervical adenopathy.  Skin:    General: Skin is warm and dry.     Capillary Refill: Capillary refill takes less than 2 seconds.     Coloration: Skin is not jaundiced or pale.     Findings: No bruising.  Neurological:     General: No focal deficit present.     Mental Status: She is alert and oriented to person, place, and time.     Cranial Nerves: No cranial nerve deficit.     Sensory: No sensory deficit.     Motor: No weakness.     Coordination: Coordination normal.     Gait: Gait normal.     Deep Tendon Reflexes: Reflexes normal.  Psychiatric:        Mood and Affect: Mood normal.  Behavior: Behavior normal. Behavior is  cooperative.        Thought Content: Thought content normal.        Judgment: Judgment normal.     Results for orders placed or performed during the hospital encounter of 06/17/18  Group B strep by PCR  Result Value Ref Range   Group B strep by PCR NEGATIVE NEGATIVE  CBC  Result Value Ref Range   WBC 14.3 (H) 4.0 - 10.5 K/uL   RBC 4.06 3.87 - 5.11 MIL/uL   Hemoglobin 10.3 (L) 12.0 - 15.0 g/dL   HCT 33.5 (L) 36.0 - 46.0 %   MCV 82.5 80.0 - 100.0 fL   MCH 25.4 (L) 26.0 - 34.0 pg   MCHC 30.7 30.0 - 36.0 g/dL   RDW 14.6 11.5 - 15.5 %   Platelets 303 150 - 400 K/uL   nRBC 0.1 0.0 - 0.2 %  RPR  Result Value Ref Range   RPR Ser Ql Non Reactive Non Reactive  Comprehensive metabolic panel  Result Value Ref Range   Sodium 136 135 - 145 mmol/L   Potassium 4.0 3.5 - 5.1 mmol/L   Chloride 106 98 - 111 mmol/L   CO2 21 (L) 22 - 32 mmol/L   Glucose, Bld 78 70 - 99 mg/dL   BUN 8 6 - 20 mg/dL   Creatinine, Ser 0.64 0.44 - 1.00 mg/dL   Calcium 9.3 8.9 - 10.3 mg/dL   Total Protein 6.0 (L) 6.5 - 8.1 g/dL   Albumin 2.4 (L) 3.5 - 5.0 g/dL   AST 15 15 - 41 U/L   ALT 10 0 - 44 U/L   Alkaline Phosphatase 221 (H) 38 - 126 U/L   Total Bilirubin 0.7 0.3 - 1.2 mg/dL   GFR calc non Af Amer NOT CALCULATED >60 mL/min   GFR calc Af Amer NOT CALCULATED >60 mL/min   Anion gap 9 5 - 15  Protein / creatinine ratio, urine  Result Value Ref Range   Creatinine, Urine 38.84 mg/dL   Total Protein, Urine <6 mg/dL   Protein Creatinine Ratio        0.00 - 0.15 mg/mg[Cre]  Glucose, capillary  Result Value Ref Range   Glucose-Capillary 84 70 - 99 mg/dL  Glucose, capillary  Result Value Ref Range   Glucose-Capillary 71 70 - 99 mg/dL  Glucose, capillary  Result Value Ref Range   Glucose-Capillary 83 70 - 99 mg/dL  Glucose, capillary  Result Value Ref Range   Glucose-Capillary 77 70 - 99 mg/dL  Glucose, capillary  Result Value Ref Range   Glucose-Capillary 78 70 - 99 mg/dL  Type and screen [place]  Result Value Ref Range   ABO/RH(D) A POS    Antibody Screen NEG    Sample Expiration      06/21/2018 Performed at [facility], [address] [place], [place] [address]   ABO/Rh  Result Value Ref Range   ABO/RH(D)      A POS Performed at [facility] [address], [place], [place] [address]        Pertinent labs & imaging results that were available during my care of the patient were reviewed by me and considered in my medical decision making.  Assessment & Plan:  Donae was seen today for new patient (initial visit) and establish care.  Diagnoses and all orders for this visit:  Encounter to establish care Doing fairly well since birth of daughter.  Will request records. Health maintenance discussed. Significant other to get vasectomy so does not wish to initiate birth control.  Gastroesophageal reflux disease without esophagitis Increasing symptoms not controlled by Tums. Will place on PPI. Diet discussed. Avoid spicy and greasy foods, alcohol, tobacco, caffeine, and chocolate. Try to remain upright at least 30 minutes to 1 hour after eating. Medications as prescribed. Report any new or worsening symptoms. Follow up in 3 months.  -     pantoprazole (PROTONIX) 40 MG tablet; Take 1 tablet (40 mg total) by mouth daily.  Abnormal glucose tolerance in pregnancy Failed 2 hour GTT. Will check CMP today. Denies symptoms of hyper or hypoglycemia. -     CMP14+EGFR  BMI 29.0-29.9,adult Diet and exercise encouraged. Labs pending.  -     TSH -     Lipid panel  Anemia, unspecified type Hgb 10.3, Hct 33.5 on 06/18/2018, will recheck to see if normalized.  -     CBC with Differential/Platelet  Chronic pain of both knees Due to breastfeeding, unable to take oral NSAIDs or use topical NSAIDs. Tylenol as needed for pain. Symptomatic care discussed. Report any new or worsening symptoms. Labs pending. Follow up in 3 months.  -     ANA,IFA RA Diag Pnl w/rflx  Tit/Patn     Continue all other maintenance medications.  Follow up plan: Return in about 3 months (around 12/17/2018), or if symptoms worsen or fail to improve, for GERD, HTN.  Educational handout given for GERD  The above assessment and management plan was discussed with the patient. The patient verbalized understanding of and has agreed to the management plan. Patient is aware to call the clinic if symptoms persist or worsen. Patient is aware when to return to the clinic for a follow-up visit. Patient educated on when it is appropriate to go to the emergency department.   Monia Pouch, FNP-C [facility] [phone]

## 2018-09-16 NOTE — Patient Instructions (Signed)

## 2018-09-17 LAB — LIPID PANEL
Chol/HDL Ratio: 6.4 ratio — ABNORMAL HIGH (ref 0.0–4.4)
Cholesterol, Total: 211 mg/dL — ABNORMAL HIGH (ref 100–199)
HDL: 33 mg/dL — ABNORMAL LOW (ref >39)
LDL Calculated: 143 mg/dL — ABNORMAL HIGH (ref 0–99)
Triglycerides: 174 mg/dL — ABNORMAL HIGH (ref 0–149)
VLDL Cholesterol Cal: 35 mg/dL (ref 5–40)

## 2018-09-17 LAB — CMP14+EGFR
ALT: 31 IU/L (ref 0–32)
AST: 30 IU/L (ref 0–40)
Albumin/Globulin Ratio: 2 (ref 1.2–2.2)
Albumin: 4.4 g/dL (ref 3.9–5.0)
Alkaline Phosphatase: 107 IU/L (ref 39–117)
BUN/Creatinine Ratio: 15 (ref 9–23)
BUN: 8 mg/dL (ref 6–20)
Bilirubin Total: 0.2 mg/dL (ref 0.0–1.2)
CO2: 21 mmol/L (ref 20–29)
Calcium: 9.8 mg/dL (ref 8.7–10.2)
Chloride: 102 mmol/L (ref 96–106)
Creatinine, Ser: 0.55 mg/dL — ABNORMAL LOW (ref 0.57–1.00)
GFR calc Af Amer: 146 mL/min/{1.73_m2} (ref >59)
GFR calc non Af Amer: 126 mL/min/{1.73_m2} (ref >59)
Globulin, Total: 2.2 g/dL (ref 1.5–4.5)
Glucose: 91 mg/dL (ref 65–99)
Potassium: 4.4 mmol/L (ref 3.5–5.2)
Sodium: 139 mmol/L (ref 134–144)
Total Protein: 6.6 g/dL (ref 6.0–8.5)

## 2018-09-17 LAB — CBC WITH DIFFERENTIAL/PLATELET
Basophils Absolute: 0.1 10*3/uL (ref 0.0–0.2)
Basos: 1 %
EOS (ABSOLUTE): 0.4 10*3/uL (ref 0.0–0.4)
Eos: 4 %
Hematocrit: 37.8 % (ref 34.0–46.6)
Hemoglobin: 13 g/dL (ref 11.1–15.9)
Immature Grans (Abs): 0 10*3/uL (ref 0.0–0.1)
Immature Granulocytes: 0 %
Lymphocytes Absolute: 2.2 10*3/uL (ref 0.7–3.1)
Lymphs: 23 %
MCH: 28.3 pg (ref 26.6–33.0)
MCHC: 34.4 g/dL (ref 31.5–35.7)
MCV: 82 fL (ref 79–97)
Monocytes Absolute: 0.7 10*3/uL (ref 0.1–0.9)
Monocytes: 8 %
Neutrophils Absolute: 6 10*3/uL (ref 1.4–7.0)
Neutrophils: 64 %
Platelets: 357 10*3/uL (ref 150–450)
RBC: 4.59 x10E6/uL (ref 3.77–5.28)
RDW: 16.9 % — ABNORMAL HIGH (ref 11.7–15.4)
WBC: 9.4 10*3/uL (ref 3.4–10.8)

## 2018-09-17 LAB — ANA,IFA RA DIAG PNL W/RFLX TIT/PATN
ANA Titer 1: POSITIVE — AB
Cyclic Citrullin Peptide Ab: 7 units (ref 0–19)
Rheumatoid fact SerPl-aCnc: <10 IU/mL (ref 0.0–13.9)

## 2018-09-17 LAB — FANA STAINING PATTERNS
Homogeneous Pattern: 1:160 {titer} — ABNORMAL HIGH
Speckled Pattern: 1:80 {titer}

## 2018-09-17 LAB — TSH: TSH: 0.704 u[IU]/mL (ref 0.450–4.500)

## 2018-09-18 ENCOUNTER — Telehealth: Payer: Self-pay | Admitting: Family Medicine

## 2018-09-18 NOTE — Telephone Encounter (Signed)
Aware of lab results  

## 2018-09-18 NOTE — Addendum Note (Signed)
Addended by: Sonny Masters on: 09/18/2018 10:34 AM   Modules accepted: Orders

## 2018-11-19 ENCOUNTER — Telehealth: Payer: Self-pay | Admitting: Family Medicine

## 2018-11-20 ENCOUNTER — Ambulatory Visit (INDEPENDENT_AMBULATORY_CARE_PROVIDER_SITE_OTHER): Payer: Medicaid Other | Admitting: Family Medicine

## 2018-11-20 ENCOUNTER — Other Ambulatory Visit: Payer: Self-pay

## 2018-11-20 ENCOUNTER — Encounter: Payer: Self-pay | Admitting: Family Medicine

## 2018-11-20 VITALS — BP 122/87 | HR 109 | Temp 97.8°F | Ht 62.0 in | Wt 158.0 lb

## 2018-11-20 DIAGNOSIS — M25562 Pain in left knee: Secondary | ICD-10-CM

## 2018-11-20 DIAGNOSIS — R768 Other specified abnormal immunological findings in serum: Secondary | ICD-10-CM | POA: Diagnosis not present

## 2018-11-20 DIAGNOSIS — M25561 Pain in right knee: Secondary | ICD-10-CM

## 2018-11-20 DIAGNOSIS — K219 Gastro-esophageal reflux disease without esophagitis: Secondary | ICD-10-CM | POA: Diagnosis not present

## 2018-11-20 DIAGNOSIS — E782 Mixed hyperlipidemia: Secondary | ICD-10-CM

## 2018-11-20 DIAGNOSIS — G8929 Other chronic pain: Secondary | ICD-10-CM

## 2018-11-20 DIAGNOSIS — M069 Rheumatoid arthritis, unspecified: Secondary | ICD-10-CM | POA: Insufficient documentation

## 2018-11-20 MED ORDER — NAPROXEN 500 MG PO TABS: 500.0000 mg | ORAL_TABLET | Freq: Two times a day (BID) | ORAL | 1 refills | Status: AC

## 2018-11-20 MED ORDER — OMEPRAZOLE 20 MG PO CPDR: 20.0000 mg | DELAYED_RELEASE_CAPSULE | Freq: Two times a day (BID) | ORAL | 6 refills | Status: AC

## 2018-11-20 MED ORDER — PREDNISONE 20 MG PO TABS: ORAL_TABLET | ORAL | 0 refills | Status: DC

## 2018-11-20 NOTE — Progress Notes (Signed)
Subjective:  Patient ID: Lisa Hall, female    DOB: 02-05-1986, 34 y.o.   MRN: [id number]  Patient Care Team: Baruch Gouty, FNP as PCP - General (Family Medicine)   Chief Complaint:  Medical Management of Chronic Issues (RA and GERD )   HPI: Lisa Hall is a 33 y.o. female presenting on 11/20/2018 for Medical Management of Chronic Issues (RA and GERD )   1. Gastroesophageal reflux disease without esophagitis  Pt currently controlling symptoms with Tums. States insurance would not cover the pantoprazole. States she continues to have daily symptoms. No increase in severity of symptoms. No hemoptysis, voice change, sore throat, dysphagia, melena, or hematochezia. She does have water brash and epigastric burning.    2. Positive ANA (antinuclear antibody) Pt has follow up scheduled with rheumatology. States she is to see them in early November. States this is the first available appointment. Pt states she is on the cancellation list. States she continues to have bilateral knee pain, swelling, and stiffness. States the stiffness increases after sitting for a while or upon waking up. No erythema noted.    3. Mixed hyperlipidemia  Pt has changes her diet. She is baking and grilling instead of frying foods. States she is eating more fruits and vegetables.    4. Chronic pain of both knees  Scheduled to follow up with rheumatology. Has been taking ibuprofen to manage the pain. No new injury, weakness, loss of function, or erythema.      Relevant past medical, surgical, family, and social history reviewed and updated as indicated.  Allergies and medications reviewed and updated. Date reviewed: Chart in Epic.   Past Medical History:  Diagnosis Date  . Elevated BP without diagnosis of hypertension 06/18/2018  . Preeclampsia     Past Surgical History:  Procedure Laterality Date  . DILATION AND CURETTAGE OF UTERUS      Social History   Socioeconomic History  . Marital status: Single     Spouse name: Not on file  . Number of children: Not on file  . Years of education: Not on file  . Highest education level: Not on file  Occupational History  . Not on file  Social Needs  . Financial resource strain: Not on file  . Food insecurity    Worry: Not on file    Inability: Not on file  . Transportation needs    Medical: Not on file    Non-medical: Not on file  Tobacco Use  . Smoking status: Current Every Day Smoker    Packs/day: 0.25    Types: Cigarettes  . Smokeless tobacco: Never Used  Substance and Sexual Activity  . Alcohol use: Never    Frequency: Never  . Drug use: Never  . Sexual activity: Not Currently    Birth control/protection: None  Lifestyle  . Physical activity    Days per week: Not on file    Minutes per session: Not on file  . Stress: To some extent  Relationships  . Social Herbalist on phone: Not on file    Gets together: Not on file    Attends religious service: Not on file    Active member of club or organization: Not on file    Attends meetings of clubs or organizations: Not on file    Relationship status: Not on file  . Intimate partner violence    Fear of current or ex partner: Not on file    Emotionally abused:  Not on file    Physically abused: Not on file    Forced sexual activity: Not on file  Other Topics Concern  . Not on file  Social History Narrative  . Not on file    Outpatient Encounter Medications as of 11/20/2018  Medication Sig  . calcium carbonate (TUMS - DOSED IN MG ELEMENTAL CALCIUM) 500 MG chewable tablet Chew 1 tablet by mouth daily.  . [DISCONTINUED] ibuprofen (ADVIL,MOTRIN) 800 MG tablet Take 1 tablet (800 mg total) by mouth 3 (three) times daily.  . naproxen (NAPROSYN) 500 MG tablet Take 1 tablet (500 mg total) by mouth 2 (two) times daily with a meal.  . omeprazole (PRILOSEC) 20 MG capsule Take 1 capsule (20 mg total) by mouth 2 (two) times daily before a meal.  . predniSONE (DELTASONE) 20 MG  tablet 2 po at sametime daily for 5 days  . [DISCONTINUED] pantoprazole (PROTONIX) 40 MG tablet Take 1 tablet (40 mg total) by mouth daily.   No facility-administered encounter medications on file as of 11/20/2018.     No Known Allergies  Review of Systems  Constitutional: Negative for activity change, appetite change, chills, diaphoresis, fatigue, fever and unexpected weight change.  HENT: Negative for sore throat, trouble swallowing and voice change.   Eyes: Negative for photophobia and visual disturbance.  Respiratory: Negative for cough, choking, chest tightness and shortness of breath.   Cardiovascular: Negative for chest pain, palpitations and leg swelling.  Gastrointestinal: Positive for abdominal pain. Negative for abdominal distention, anal bleeding, blood in stool, constipation, diarrhea, nausea, rectal pain and vomiting.  Endocrine: Negative for cold intolerance, heat intolerance, polydipsia, polyphagia and polyuria.  Genitourinary: Negative for decreased urine volume and difficulty urinating.  Musculoskeletal: Positive for arthralgias and joint swelling. Negative for myalgias.  Skin: Negative for color change, pallor and rash.  Neurological: Negative for dizziness, tremors, seizures, syncope, facial asymmetry, speech difficulty, weakness, light-headedness, numbness and headaches.  Psychiatric/Behavioral: Negative for confusion.  All other systems reviewed and are negative.       Objective:  BP 122/87   Pulse (!) 109   Temp 97.8 F (36.6 C)   Ht _0  (1.575 m)   Wt 158 lb (71.7 kg)   LMP 11/01/2018   Breastfeeding No   BMI 28.90 kg/m    Wt Readings from Last 3 Encounters:  11/20/18 158 lb (71.7 kg)  09/16/18 159 lb 3.2 oz (72.2 kg)  06/18/18 179 lb 7.3 oz (81.4 kg)    Physical Exam Vitals signs and nursing note reviewed.  Constitutional:      General: She is not in acute distress.    Appearance: Normal appearance. She is well-developed and well-groomed. She  is not ill-appearing, toxic-appearing or diaphoretic.  HENT:     Head: Normocephalic and atraumatic.     Jaw: There is normal jaw occlusion.     Right Ear: Hearing normal.     Left Ear: Hearing normal.     Nose: Nose normal.     Mouth/Throat:     Lips: Pink.     Mouth: Mucous membranes are moist.     Pharynx: Oropharynx is clear. Uvula midline.  Eyes:     General: Lids are normal.     Extraocular Movements: Extraocular movements intact.     Conjunctiva/sclera: Conjunctivae normal.     Pupils: Pupils are equal, round, and reactive to light.  Neck:     Musculoskeletal: Normal range of motion and neck supple.     Thyroid: No thyroid  mass, thyromegaly or thyroid tenderness.     Vascular: No carotid bruit or JVD.     Trachea: Trachea and phonation normal.  Cardiovascular:     Rate and Rhythm: Normal rate and regular rhythm.     Chest Wall: PMI is not displaced.     Pulses: Normal pulses.     Heart sounds: Normal heart sounds. No murmur. No friction rub. No gallop.   Pulmonary:     Effort: Pulmonary effort is normal. No respiratory distress.     Breath sounds: Normal breath sounds. No wheezing.  Abdominal:     General: Bowel sounds are normal. There is no distension or abdominal bruit.     Palpations: Abdomen is soft. There is no hepatomegaly or splenomegaly.     Tenderness: There is no abdominal tenderness. There is no right CVA tenderness or left CVA tenderness.     Hernia: No hernia is present.  Musculoskeletal:     Right knee: She exhibits decreased range of motion, swelling and bony tenderness. She exhibits no effusion, no ecchymosis, no deformity, no laceration, no erythema, normal alignment, no LCL laxity, normal patellar mobility, normal meniscus and no MCL laxity. No tenderness found.     Left knee: She exhibits decreased range of motion, swelling and bony tenderness. She exhibits no effusion, no ecchymosis, no deformity, no laceration, no erythema, normal alignment, no LCL  laxity, normal patellar mobility, normal meniscus and no MCL laxity. No tenderness found.     Right lower leg: No edema.     Left lower leg: No edema.  Lymphadenopathy:     Cervical: No cervical adenopathy.  Skin:    General: Skin is warm and dry.     Capillary Refill: Capillary refill takes less than 2 seconds.     Coloration: Skin is not cyanotic, jaundiced or pale.     Findings: No rash.  Neurological:     General: No focal deficit present.     Mental Status: She is alert and oriented to person, place, and time.     Cranial Nerves: Cranial nerves are intact.     Sensory: Sensation is intact.     Motor: Motor function is intact.     Coordination: Coordination is intact.     Gait: Gait is intact.     Deep Tendon Reflexes: Reflexes are normal and symmetric.  Psychiatric:        Attention and Perception: Attention and perception normal.        Mood and Affect: Mood and affect normal.        Speech: Speech normal.        Behavior: Behavior normal. Behavior is cooperative.        Thought Content: Thought content normal.        Cognition and Memory: Cognition and memory normal.        Judgment: Judgment normal.     Results for orders placed or performed in visit on 09/16/18  CMP14+EGFR  Result Value Ref Range   Glucose 91 65 - 99 mg/dL   BUN 8 6 - 20 mg/dL   Creatinine, Ser 0.55 (L) 0.57 - 1.00 mg/dL   GFR calc non Af Amer 126 >59 mL/min/1.73   GFR calc Af Amer 146 >59 mL/min/1.73   BUN/Creatinine Ratio 15 9 - 23   Sodium 139 134 - 144 mmol/L   Potassium 4.4 3.5 - 5.2 mmol/L   Chloride 102 96 - 106 mmol/L   CO2 21 20 - 29 mmol/L  Calcium 9.8 8.7 - 10.2 mg/dL   Total Protein 6.6 6.0 - 8.5 g/dL   Albumin 4.4 3.9 - 5.0 g/dL   Globulin, Total 2.2 1.5 - 4.5 g/dL   Albumin/Globulin Ratio 2.0 1.2 - 2.2   Bilirubin Total 0.2 0.0 - 1.2 mg/dL   Alkaline Phosphatase 107 39 - 117 IU/L   AST 30 0 - 40 IU/L   ALT 31 0 - 32 IU/L  TSH  Result Value Ref Range   TSH 0.704 0.450 -  4.500 uIU/mL  Lipid panel  Result Value Ref Range   Cholesterol, Total 211 (H) 100 - 199 mg/dL   Triglycerides 174 (H) 0 - 149 mg/dL   HDL 33 (L) >39 mg/dL   VLDL Cholesterol Cal 35 5 - 40 mg/dL   LDL Calculated 143 (H) 0 - 99 mg/dL   Chol/HDL Ratio 6.4 (H) 0.0 - 4.4 ratio  CBC with Differential/Platelet  Result Value Ref Range   WBC 9.4 3.4 - 10.8 x10E3/uL   RBC 4.59 3.77 - 5.28 x10E6/uL   Hemoglobin 13.0 11.1 - 15.9 g/dL   Hematocrit 37.8 34.0 - 46.6 %   MCV 82 79 - 97 fL   MCH 28.3 26.6 - 33.0 pg   MCHC 34.4 31.5 - 35.7 g/dL   RDW 16.9 (H) 11.7 - 15.4 %   Platelets 357 150 - 450 x10E3/uL   Neutrophils 64 Not Estab. %   Lymphs 23 Not Estab. %   Monocytes 8 Not Estab. %   Eos 4 Not Estab. %   Basos 1 Not Estab. %   Neutrophils Absolute 6.0 1.4 - 7.0 x10E3/uL   Lymphocytes Absolute 2.2 0.7 - 3.1 x10E3/uL   Monocytes Absolute 0.7 0.1 - 0.9 x10E3/uL   EOS (ABSOLUTE) 0.4 0.0 - 0.4 x10E3/uL   Basophils Absolute 0.1 0.0 - 0.2 x10E3/uL   Immature Granulocytes 0 Not Estab. %   Immature Grans (Abs) 0.0 0.0 - 0.1 x10E3/uL  ANA,IFA RA Diag Pnl w/rflx Tit/Patn  Result Value Ref Range   ANA Titer 1 Positive (A)    Rhuematoid fact SerPl-aCnc <10.0 0.0 - 74.1 IU/mL   Cyclic Citrullin Peptide Ab 7 0 - 19 units  FANA Staining Patterns  Result Value Ref Range   Homogeneous Pattern 1:160 (H)    Speckled Pattern 1:80    Note: Comment        Pertinent labs & imaging results that were available during my care of the patient were reviewed by me and considered in my medical decision making.  Assessment & Plan:  Tahliyah was seen today for medical management of chronic issues.  Diagnoses and all orders for this visit:  Gastroesophageal reflux disease without esophagitis Insurance would not cover pantoprazole. Will change to omeprazole. Avoid triggers. Report any new or worsening symptoms.  -     omeprazole (PRILOSEC) 20 MG capsule; Take 1 capsule (20 mg total) by mouth 2 (two) times daily  before a meal.  Positive ANA (antinuclear antibody) Chronic pain of both knees -     predniSONE (DELTASONE) 20 MG tablet; 2 po at sametime daily for 5 days -     naproxen (NAPROSYN) 500 MG tablet; Take 1 tablet (500 mg total) by mouth 2 (two) times daily with a meal.  Mixed hyperlipidemia Diet and exercise encouraged. Aggressive lifestyle changes encouraged. Will recheck levels in 3 months.     Continue all other maintenance medications.  Follow up plan: Return in about 3 months (around 02/20/2019), or if symptoms worsen  or fail to improve, for GERD.  Educational handout given for survey, COVID-19  The above assessment and management plan was discussed with the patient. The patient verbalized understanding of and has agreed to the management plan. Patient is aware to call the clinic if symptoms persist or worsen. Patient is aware when to return to the clinic for a follow-up visit. Patient educated on when it is appropriate to go to the emergency department.   Monia Pouch, FNP-C [facility] [phone] 11/20/18

## 2018-11-20 NOTE — Patient Instructions (Signed)
It was a pleasure seeing you today, Sherolyn,  Information regarding what we discussed is included in this packet.  Please make an appointment to see me in 3 months.   In a few days you may receive a survey in the mail or online from [employer] regarding your visit with [place] today. Please take a moment to fill this out. Your feedback is very important to our office. It can help [place] better understand your needs as well as improve your experience and satisfaction. Thank you for taking your time to complete it. We care about you.  Because of recent events of COVID-19 ("Coronavirus"), please follow CDC recommendations:   1. Wash your hand frequently 2. Avoid touching your face 3. Stay away from people who are sick 4. If you have symptoms such as fever, cough, shortness of breath then call your healthcare provider for further guidance 5. If you are sick, STAY AT HOME, unless otherwise directed by your healthcare provider. 6. Follow directions from state and national officials regarding staying safe    Please feel free to call our office if any questions or concerns arise.  Warm Regards, Monia Pouch, FNP-C [facility] [address] [place], [place] [address] [phone]

## 2019-02-09 IMAGING — US US MFM OB FOLLOW-UP
1 series · 13 of 28 positions shown · non-contrast
Comparison: none

[Series 1: us mfm ob follow-up · 13 of 71 slices shown]
[im 3/71]
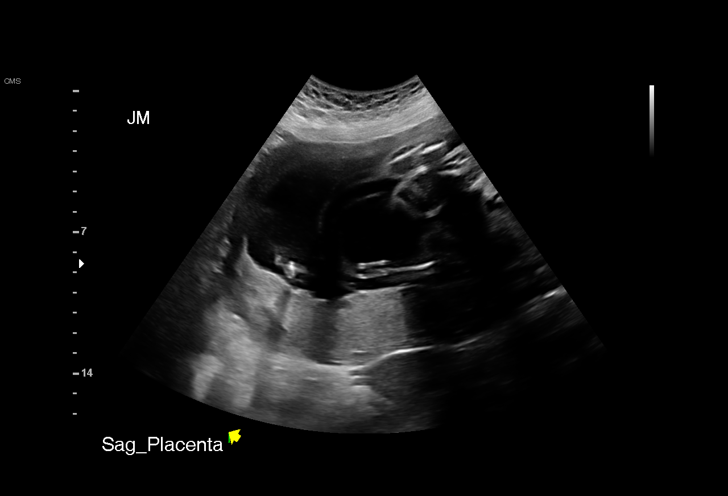
[im 8/71]
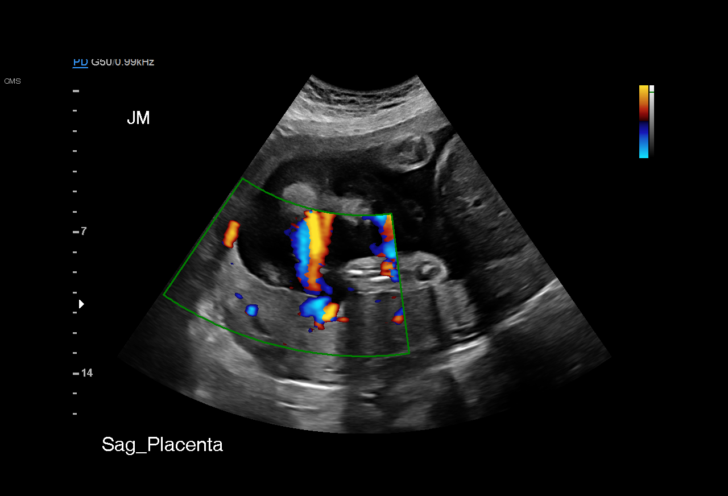
[im 13/71]
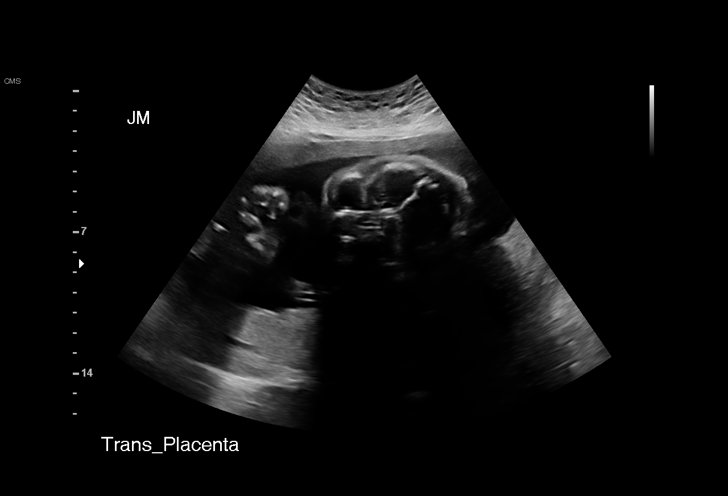
[im 19/71]
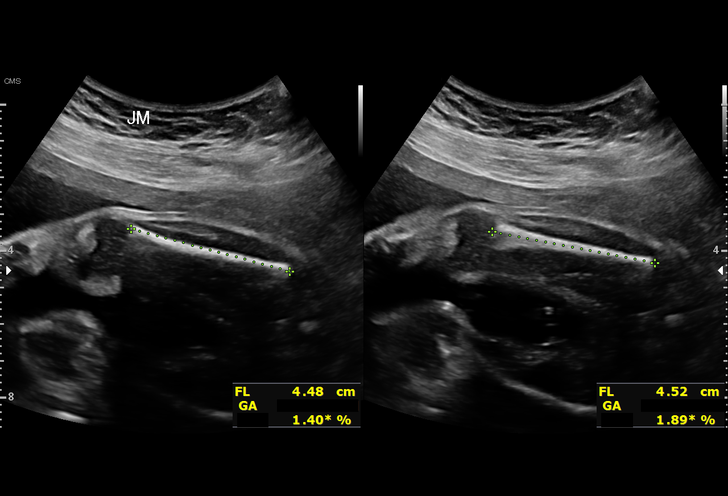
[im 24/71]
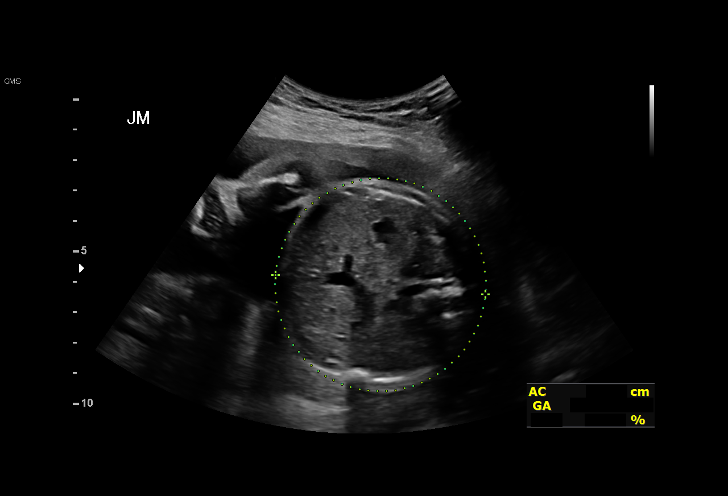
[im 29/71]
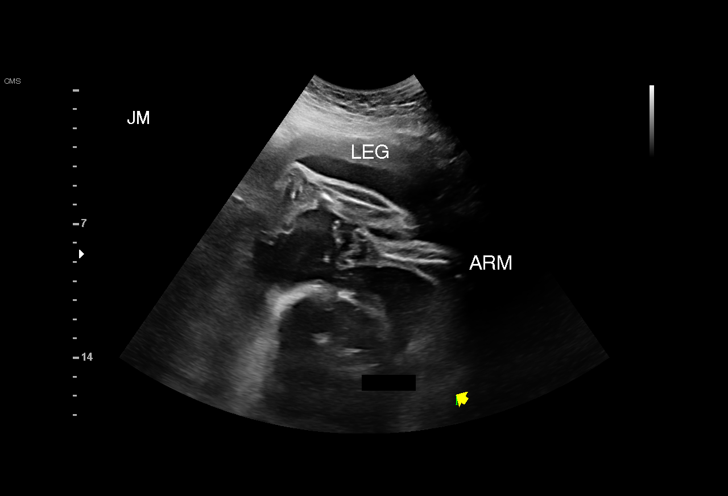
[im 37/71]
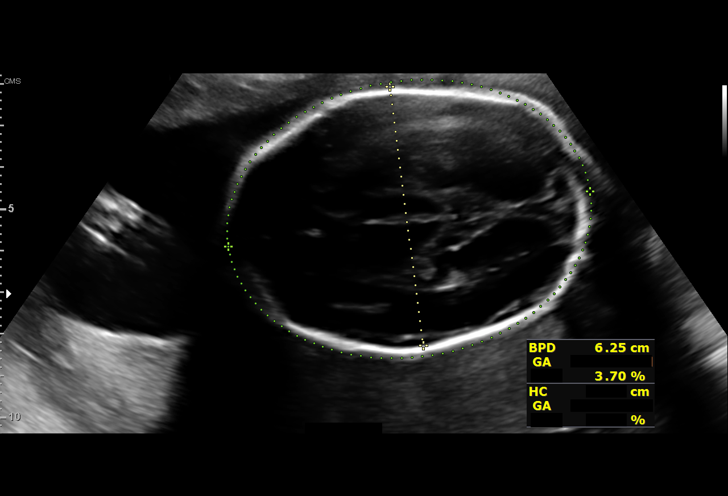
[im 42/71]
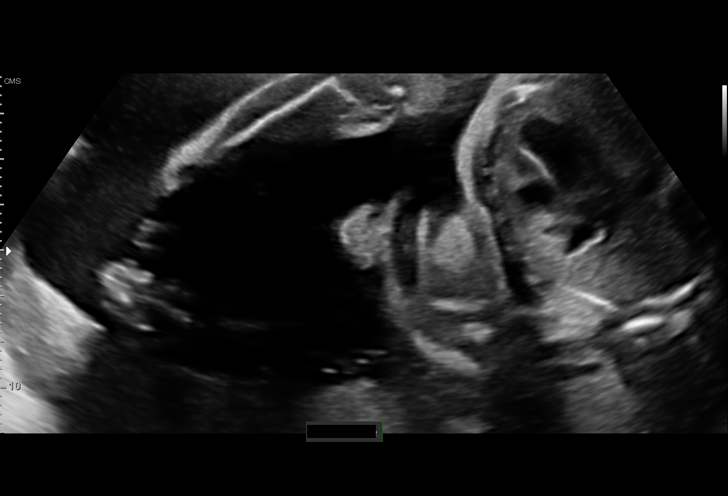
[im 47/71]
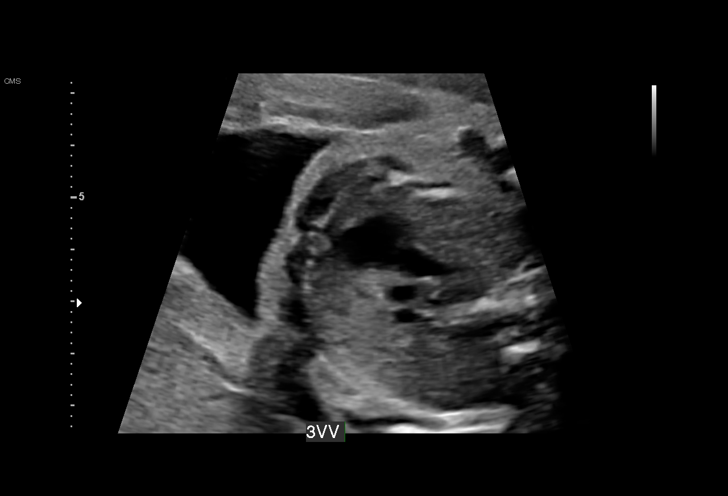
[im 52/71]
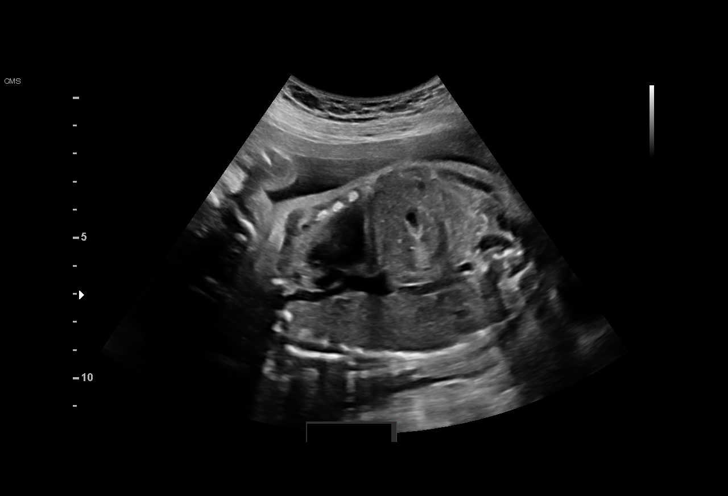
[im 58/71]
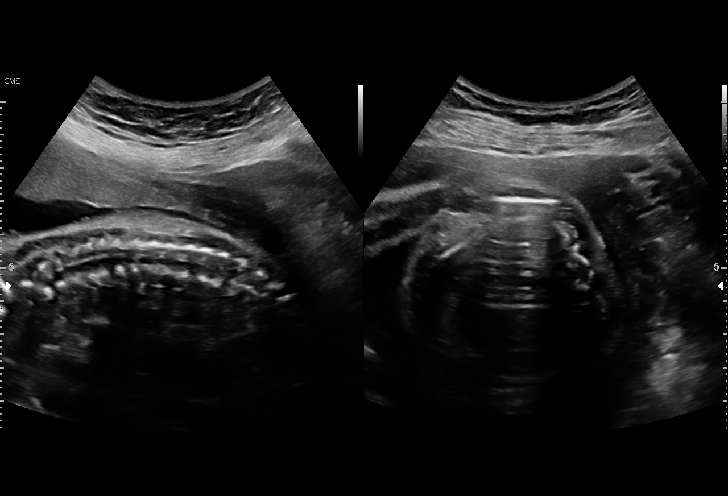
[im 63/71]
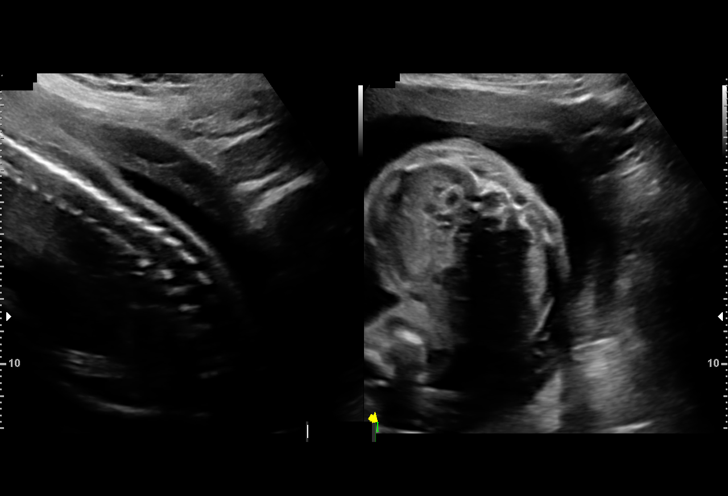
[im 68/71]
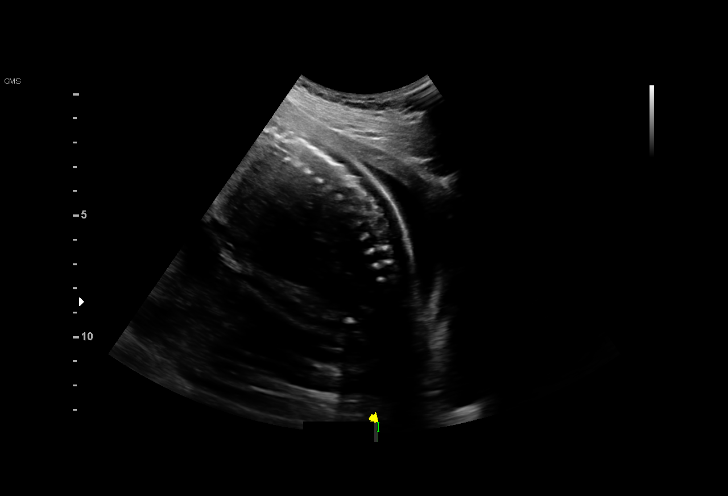

[13 of 28 positions shown; findings below may reference images not displayed]

----------------------------------------------------------------------

 ----------------------------------------------------------------------
Indications

  Antenatal follow-up for nonvisualized fetal
  anatomy
  Poor obstetric history: Previous
  preeclampsia / eclampsia/gestational HTN
  26 weeks gestation of pregnancy
 ----------------------------------------------------------------------
Fetal Evaluation

 Num Of Fetuses:         1
 Fetal Heart Rate(bpm):  137
 Cardiac Activity:       Observed
 Presentation:           Breech
 Placenta:               Posterior
 P. Cord Insertion:      Visualized

 Amniotic Fluid
 AFI FV:      Within normal limits

                             Largest Pocket(cm)

Biometry

 BPD:      62.5  mm     G. Age:  25w 2d         10  %    CI:        67.22   %    70 - 86
                                                         FL/HC:      18.9   %    18.6 -
 HC:      244.1  mm     G. Age:  26w 4d         27  %    HC/AC:      1.12        1.04 -
 AC:      218.6  mm     G. Age:  26w 3d         39  %    FL/BPD:     73.8   %    71 - 87
 FL:       46.1  mm     G. Age:  25w 2d         10  %    FL/AC:      21.1   %    20 - 24
 HUM:      42.9  mm     G. Age:  25w 5d         28  %

 Est. FW:     869  gm    1 lb 15 oz      42  %
OB History

 Gravidity:    3         Term:   1         SAB:   1
 Living:       1
Gestational Age

 LMP:           26w 3d        Date:  09/10/17                 EDD:   06/17/18
 U/S Today:     25w 6d                                        EDD:   06/21/18
 Best:          26w 3d     Det. By:  LMP  (09/10/17)          EDD:   06/17/18
Anatomy

 Cranium:               Appears normal         Aortic Arch:            Appears normal
 Cavum:                 Appears normal         Ductal Arch:            Not well visualized
 Ventricles:            Appears normal         Diaphragm:              Appears normal
 Choroid Plexus:        Appears normal         Stomach:                Appears normal, left
                                                                       sided
 Cerebellum:            Appears normal         Abdomen:                Appears normal
 Posterior Fossa:       Appears normal         Abdominal Wall:         Appears nml (cord
                                                                       insert, abd wall)
 Nuchal Fold:           Not applicable (>20    Cord Vessels:           Appears normal (3
                        wks GA)                                        vessel cord)
 Face:                  Appears normal         Kidneys:                Appear normal
                        (orbits and profile)
 Lips:                  Appears normal         Bladder:                Appears normal
 Thoracic:              Appears normal         Spine:                  Limited views
                                                                       appear normal
 Heart:                 Appears normal         Upper Extremities:      Previously seen
                        (4CH, axis, and
                        situs)
 RVOT:                  Appears normal         Lower Extremities:      Previously seen
 LVOT:                  Appears normal

 Other:  Heels previously visualized. Technically difficult due to fetal position.
Cervix Uterus Adnexa

 Cervix
 Normal appearance by transabdominal scan.

 Uterus
 No abnormality visualized.

 Left Ovary
 Within normal limits.

 Right Ovary
 Within normal limits.

 Cul De Sac
 No free fluid seen.

 Adnexa
 No abnormality visualized.
Comments

 U/S images reviewed. Findings reviewed with patient.
 Appropriate fetal growth is noted.   No fetal abnormalities are
 seen.
 BP - 121-75.  Patient denies headaches, visual disturbance,
 mid-epigastric or RUQ pain.
 Questions answered.
 10 minutes spent face to face with patient.
 Recommendations: 1) Serial U/S every 4 weeks for fetal
 growth  2) Weekly BPP beginning @ 36 weeks
Recommendations

  1) Serial U/S every 4 weeks for fetal growth  2) Weekly BPP
 beginning @ 36 weeks

              Strauss, Yordi

## 2019-02-09 NOTE — Progress Notes (Signed)
Office Visit Note  Patient: Lisa Hall             Date of Birth: [birth year]           MRN: [id number]             PCP: Baruch Gouty, FNP Referring: Baruch Gouty, FNP Visit Date: 02/23/2019 Occupation: Biochemist, clinical  Subjective:  New Patient (Initial Visit) (RA, Bil hip pain, right worse than left, right 2nd digit pain)   History of Present Illness: Lisa Hall is a 33 y.o. female seen in consultation per request of her PCP.  According to patient during her pregnancy she started having hip joint and knee joint pain.  She felt it was related to her pregnancy.  She is 8 months postpartum now.  She states after the delivery of her child she is secondary to have discomfort in her hips and knees.  When she was 2 months postpartum she went to see her PCP and her labs were abnormal.  She was referred to me.  She states she continues to have pain and discomfort in her hip joints more so on the right than the left hip.  She also has pain in her bilateral knee joints.  She has ankle joint pain and swelling.  She has some discomfort in her neck and her shoulders.  She states her elbow joints locked up when she sleeps at night.  Recently she has been having pain and swelling in her right index finger.  She states she has difficulty writing.  She has chronic lower back pain as she has a scoliosis per patient.  There is no family history of autoimmune disease.  She was given prednisone 4 months ago by her PCP which helped her symptoms remarkably.  And then the symptoms recurred after finishing the prednisone.  She takes naproxen on daily basis for pain.  According to patient it is not very effective.  Activities of Daily Living:  Patient reports morning stiffness for 2 hours.   Patient Reports nocturnal pain.  Difficulty dressing/grooming: Denies Difficulty climbing stairs: Reports Difficulty getting out of chair: Reports Difficulty using hands for taps, buttons, cutlery, and/or writing:  Reports  Review of Systems  Constitutional: Positive for fatigue. Negative for night sweats, weight gain and weight loss.  HENT: Negative for mouth sores, trouble swallowing, trouble swallowing, mouth dryness and nose dryness.   Eyes: Positive for dryness. Negative for pain, redness and visual disturbance.  Respiratory: Negative for cough, shortness of breath and difficulty breathing.   Cardiovascular: Positive for swelling in legs/feet. Negative for chest pain, palpitations, hypertension and irregular heartbeat.  Gastrointestinal: Positive for constipation. Negative for blood in stool and diarrhea.  Endocrine: Negative for increased urination.  Genitourinary: Negative for difficulty urinating and vaginal dryness.  Musculoskeletal: Positive for arthralgias, joint pain, joint swelling, morning stiffness and muscle tenderness. Negative for gait problem, myalgias, muscle weakness and myalgias.  Skin: Negative for color change, rash, hair loss, skin tightness, ulcers and sensitivity to sunlight.  Allergic/Immunologic: Negative for susceptible to infections.  Neurological: Positive for numbness. Negative for dizziness, memory loss, night sweats and weakness.  Hematological: Positive for bruising/bleeding tendency. Negative for swollen glands.  Psychiatric/Behavioral: Positive for sleep disturbance. Negative for depressed mood. The patient is nervous/anxious.     PMFS History:  Patient Active Problem List   Diagnosis Date Noted   Mixed hyperlipidemia 11/20/2018   Rheumatoid arthritis ([place]) 11/20/2018   Chronic pain of both knees 11/20/2018   Gastroesophageal  reflux disease without esophagitis 09/16/2018   BMI 29.0-29.9,adult 09/16/2018   Abnormal glucose tolerance in pregnancy 05/06/2018   History of pre-eclampsia 02/10/2018    Past Medical History:  Diagnosis Date   Elevated BP without diagnosis of hypertension 06/18/2018   Preeclampsia    Rheumatoid arthritis (HCC)     Family  History  Problem Relation Age of Onset   Hypertension Mother    Diabetes Mother    Past Surgical History:  Procedure Laterality Date   DILATION AND CURETTAGE OF UTERUS     Social History   Social History Narrative   Not on file   Immunization History  Administered Date(s) Administered   Influenza,inj,Quad PF,6+ Mos 02/10/2018   Pneumococcal Polysaccharide-23 06/20/2018   Tdap 05/01/2018     Objective: Vital Signs: BP 126/83 (BP Location: Right Arm, Patient Position: Sitting, Cuff Size: Normal)    Pulse (!) 102    Resp 14    Ht 5\' 2"  (1.575 m)    Wt 154 lb 9.6 oz (70.1 kg)    LMP 01/23/2019    BMI 28.28 kg/m    Physical Exam Vitals signs and nursing note reviewed.  Constitutional:      Appearance: She is well-developed.  HENT:     Head: Normocephalic and atraumatic.  Eyes:     Conjunctiva/sclera: Conjunctivae normal.  Neck:     Musculoskeletal: Normal range of motion.  Cardiovascular:     Rate and Rhythm: Normal rate and regular rhythm.     Heart sounds: Normal heart sounds.  Pulmonary:     Effort: Pulmonary effort is normal.     Breath sounds: Normal breath sounds.  Abdominal:     General: Bowel sounds are normal.     Palpations: Abdomen is soft.  Lymphadenopathy:     Cervical: No cervical adenopathy.  Skin:    General: Skin is warm and dry.     Capillary Refill: Capillary refill takes less than 2 seconds.  Neurological:     Mental Status: She is alert and oriented to person, place, and time.  Psychiatric:        Behavior: Behavior normal.      Musculoskeletal Exam: C-spine was in good range of motion.  Thoracic and lumbar spine were in good range of motion.  Shoulder joints and elbow joints are in good range of motion.  She has hypermobility in her elbows and her wrist joints.  She has tenderness on palpation of her MCPs and PIPs as described below.  She has discomfort range of motion of bilateral hip joints.  She is swelling in her right knee joint and  left ankle joint.  None of the other joints were swollen.  CDAI Exam: CDAI Score: 5.2  Patient Global: 6 mm; Provider Global: 6 mm Swollen: 2 ; Tender: 6  Joint Exam      Right  Left  MCP 2   Tender     MCP 3   Tender     Hip   Tender   Tender  Knee  Swollen Tender     Ankle     Swollen Tender     Investigation: Findings:  09/16/18: ANA 1:160 H, 1:80 speckled, RF<10, CCP 7, TSH 0.704  Component     Latest Ref Rng & Units 09/16/2018  ANA Titer 1      Positive (A)  RA Latex Turbid.     0.0 - 13.9 IU/mL <10.0  Cyclic Citrullin Peptide Ab     0 - 19  units 7  Homogeneous Pattern      1:160 (H)  Speckled Pattern      1:80  NOTE:      Comment   Imaging: Xr Hips Bilat W Or W/o Pelvis 3-4 Views  Result Date: 02/23/2019 No hip joint narrowing was noted.  No chondrocalcinosis was noted.  SI joints are within normal limits. Impression: Unremarkable x-ray of the hip joints.  Xr Ankle 2 Views Left  Result Date: 02/23/2019 No tibiotalar or subtalar joint space narrowing was noted.  Soft tissue swelling was noted. Impression: Unremarkable x-ray of the ankle joint.  Xr Foot 2 Views Left  Result Date: 02/23/2019 PIP and DIP narrowing was noted.  No MTP, intertarsal joint space narrowing was noted.  No erosive changes were noted. Impression: These findings are consistent with osteoarthritis of the foot.  Xr Foot 2 Views Right  Result Date: 02/23/2019 PIP and DIP narrowing was noted.  No MTP, intertarsal joint space narrowing was noted.  No erosive changes were noted. Impression: These findings are consistent with osteoarthritis of the foot.  Xr Hand 2 View Left  Result Date: 02/23/2019 No MCP PIP or DIP narrowing was noted.  No intercarpal radiocarpal joint space narrowing was noted.  No erosive changes were noted. Impression: Unremarkable x-ray of the hand.  Xr Hand 2 View Right  Result Date: 02/23/2019 No MCP PIP or DIP narrowing was noted.  No intercarpal radiocarpal joint  space narrowing was noted.  No erosive changes were noted. Impression: Unremarkable x-ray of the hand.  Xr Knee 3 View Right  Result Date: 02/23/2019 No medial lateral compartment narrowing was noted.  No chondrocalcinosis was noted.  Mild patellofemoral narrowing was noted. 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   Recent Labs: Lab Results  Component Value Date   WBC 9.4 09/16/2018   HGB 13.0 09/16/2018   PLT 357 09/16/2018   NA 139 09/16/2018   K 4.4 09/16/2018   CL 102 09/16/2018   CO2 21 09/16/2018   GLUCOSE 91 09/16/2018   BUN 8 09/16/2018   CREATININE 0.55 (L) 09/16/2018   BILITOT 0.2 09/16/2018   ALKPHOS 107 09/16/2018   AST 30 09/16/2018   ALT 31 09/16/2018   PROT 6.6 09/16/2018   ALBUMIN 4.4 09/16/2018   CALCIUM 9.8 09/16/2018   GFRAA 146 09/16/2018    Speciality Comments: No specialty comments available.  Procedures:  No procedures performed Allergies: Patient has no known allergies.   Assessment / Plan:     Visit Diagnoses: Autoimmune disease (HCC) -patient gives history of inflammatory arthritis for the last 6 months.  She has been experiencing pain and discomfort in multiple joints and swelling in some of the joints as described above.  She also gives history of fatigue.  There is no history of oral ulcers, nasal ulcers, malar rash or photosensitivity.  She has low titer positive ANA.  I will obtain additional labs today.  She is was given a course of prednisone and had a good response few months back.  My plan is to start her on prednisone 20 mg p.o. daily and taper by 5 mg every week.  I would also start her on Plaquenil 200 mg p.o. twice daily Monday to Friday until we have more information about her labs.  Indications side effects contraindications were discussed.  She will need baseline eye examination and then yearly eye examination.  She is currently not nursing and her fianc had vasectomy.  09/16/18: ANA  1:160  H, 1:80 speckled, RF<10, CCP 7, TSH 0.704 - Plan: Urinalysis, Routine w reflex microscopic, ANA, Anti-scleroderma antibody, RNP Antibody, Anti-Smith antibody, Sjogrens syndrome-A extractable nuclear antibody, Sjogrens syndrome-B extractable nuclear antibody, Anti-DNA antibody, double-stranded, C3 and C4, Beta-2 glycoprotein antibodies, Cardiolipin antibodies, IgG, IgM, IgA, Lupus Anticoagulant Eval w/Reflex  Pain in both hands -she is synovitis in her right second and third MCP joints.  Plan: XR Hand 2 View Right, XR Hand 2 View Left, x-rays obtained today were unremarkable.  Sedimentation rate, 14-3-3 eta Protein  Chronic pain of both hips -she has discomfort range of motion of bilateral hips.  Plan: XR HIPS BILAT W OR W/O PELVIS 3-4 VIEWS.  X-ray of bilateral hip joints were unremarkable.  Chronic pain of right knee -she has pain and swelling in her right knee joint.  Plan: XR KNEE 3 VIEW RIGHT.  The x-ray showed mild chondromalacia patella.  Pain in left ankle and joints of left foot -she had pain and swelling in her left ankle joint.  Plan: XR Ankle 2 Views Left.  The x-ray was unremarkable.  Pain in both feet -she has discomfort in her feet.  Plan: XR Foot 2 Views Right, XR Foot 2 Views Left.  The x-rays were unremarkable.  Mild osteoarthritic changes are noted.  High risk medication use - Plan: CBC with Differential/Platelet, COMPLETE METABOLIC PANEL WITH GFR, Glucose 6 phosphate dehydrogenase, Hepatitis B core antibody, IgM, Hepatitis B surface antigen, Hepatitis C antibody, QuantiFERON-TB Gold Plus, HIV Antibody (routine testing w rflx), Serum protein electrophoresis with reflex, IgG, IgA, IgM  Other fatigue - Plan: CK  Mixed hyperlipidemia  Gastroesophageal reflux disease without esophagitis  History of pre-eclampsia  Orders: Orders Placed This Encounter  Procedures   XR Hand 2 View Right   XR Hand 2 View Left   XR HIPS BILAT W OR W/O PELVIS 3-4 VIEWS   XR KNEE 3 VIEW RIGHT    XR Ankle 2 Views Left   XR Foot 2 Views Right   XR Foot 2 Views Left   CBC with Differential/Platelet   COMPLETE METABOLIC PANEL WITH GFR   Urinalysis, Routine w reflex microscopic   CK   Sedimentation rate   14-3-3 eta Protein   ANA   Anti-scleroderma antibody   RNP Antibody   Anti-Smith antibody   Sjogrens syndrome-A extractable nuclear antibody   Sjogrens syndrome-B extractable nuclear antibody   Anti-DNA antibody, double-stranded   C3 and C4   Beta-2 glycoprotein antibodies   Cardiolipin antibodies, IgG, IgM, IgA   Lupus Anticoagulant Eval w/Reflex   Glucose 6 phosphate dehydrogenase   Hepatitis B core antibody, IgM   Hepatitis B surface antigen   Hepatitis C antibody   QuantiFERON-TB Gold Plus   HIV Antibody (routine testing w rflx)   Serum protein electrophoresis with reflex   IgG, IgA, IgM   CBC with Differential/Platelet   COMPLETE METABOLIC PANEL WITH GFR   Meds ordered this encounter  Medications   predniSONE (DELTASONE) 5 MG tablet    Sig: Take 4 tablets (20 mg total) by mouth daily with breakfast for 7 days, THEN 3 tablets (15 mg total) daily with breakfast for 7 days, THEN 2 tablets (10 mg total) daily with breakfast for 7 days, THEN 1 tablet (5 mg total) daily with breakfast for 7 days.    Dispense:  70 tablet    Refill:  0   hydroxychloroquine (PLAQUENIL) 200 MG tablet    Sig: Take 1 tablet 200 mg BID Monday-Friday  Dispense:  120 tablet    Refill:  0    Face-to-face time spent with patient was 60 minutes. Greater than 50% of time was spent in counseling and coordination of care.  Follow-Up Instructions: Return for Next avaliable appointment for new patient follow up.   Pollyann SavoyShaili Robert Sunga, MD  Note - This record has been created using Animal [employer]istDragon software.  Chart creation errors have been sought, but may not always  have been located. Such creation errors do not reflect on  the standard of medical care.

## 2019-02-23 ENCOUNTER — Ambulatory Visit: Payer: Self-pay

## 2019-02-23 ENCOUNTER — Other Ambulatory Visit: Payer: Self-pay

## 2019-02-23 ENCOUNTER — Encounter: Payer: Self-pay | Admitting: Rheumatology

## 2019-02-23 ENCOUNTER — Ambulatory Visit: Payer: Medicaid Other | Admitting: Rheumatology

## 2019-02-23 VITALS — BP 126/83 | HR 102 | Resp 14 | Ht 62.0 in | Wt 154.6 lb

## 2019-02-23 DIAGNOSIS — M359 Systemic involvement of connective tissue, unspecified: Secondary | ICD-10-CM

## 2019-02-23 DIAGNOSIS — K219 Gastro-esophageal reflux disease without esophagitis: Secondary | ICD-10-CM

## 2019-02-23 DIAGNOSIS — M25551 Pain in right hip: Secondary | ICD-10-CM | POA: Diagnosis not present

## 2019-02-23 DIAGNOSIS — M79671 Pain in right foot: Secondary | ICD-10-CM

## 2019-02-23 DIAGNOSIS — R5383 Other fatigue: Secondary | ICD-10-CM

## 2019-02-23 DIAGNOSIS — M25561 Pain in right knee: Secondary | ICD-10-CM | POA: Diagnosis not present

## 2019-02-23 DIAGNOSIS — M25552 Pain in left hip: Secondary | ICD-10-CM

## 2019-02-23 DIAGNOSIS — M79642 Pain in left hand: Secondary | ICD-10-CM

## 2019-02-23 DIAGNOSIS — M25572 Pain in left ankle and joints of left foot: Secondary | ICD-10-CM

## 2019-02-23 DIAGNOSIS — M79641 Pain in right hand: Secondary | ICD-10-CM

## 2019-02-23 DIAGNOSIS — E782 Mixed hyperlipidemia: Secondary | ICD-10-CM

## 2019-02-23 DIAGNOSIS — M79672 Pain in left foot: Secondary | ICD-10-CM | POA: Diagnosis not present

## 2019-02-23 DIAGNOSIS — Z79899 Other long term (current) drug therapy: Secondary | ICD-10-CM

## 2019-02-23 DIAGNOSIS — G8929 Other chronic pain: Secondary | ICD-10-CM

## 2019-02-23 DIAGNOSIS — Z8759 Personal history of other complications of pregnancy, childbirth and the puerperium: Secondary | ICD-10-CM

## 2019-02-23 MED ORDER — HYDROXYCHLOROQUINE SULFATE 200 MG PO TABS: ORAL_TABLET | ORAL | 0 refills | Status: AC

## 2019-02-23 MED ORDER — PREDNISONE 5 MG PO TABS: ORAL_TABLET | ORAL | 0 refills | Status: AC

## 2019-02-23 NOTE — Progress Notes (Signed)
Pharmacy Note  Subjective: Patient presents today to the [facility] to see Dr. Estanislado Pandy.  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  She is naive to therapy.  Objective: CMP     Component Value Date/Time   NA 139 09/16/2018 1537   K 4.4 09/16/2018 1537   CL 102 09/16/2018 1537   CO2 21 09/16/2018 1537   GLUCOSE 91 09/16/2018 1537   GLUCOSE 78 06/18/2018 0122   BUN 8 09/16/2018 1537   CREATININE 0.55 (L) 09/16/2018 1537   CALCIUM 9.8 09/16/2018 1537   PROT 6.6 09/16/2018 1537   ALBUMIN 4.4 09/16/2018 1537   AST 30 09/16/2018 1537   ALT 31 09/16/2018 1537   ALKPHOS 107 09/16/2018 1537   BILITOT 0.2 09/16/2018 1537   GFRNONAA 126 09/16/2018 1537   GFRAA 146 09/16/2018 1537    CBC    Component Value Date/Time   WBC 9.4 09/16/2018 1537   WBC 14.3 (H) 06/18/2018 0122   RBC 4.59 09/16/2018 1537   RBC 4.06 06/18/2018 0122   HGB 13.0 09/16/2018 1537   HCT 37.8 09/16/2018 1537   PLT 357 09/16/2018 1537   MCV 82 09/16/2018 1537   MCH 28.3 09/16/2018 1537   MCH 25.4 (L) 06/18/2018 0122   MCHC 34.4 09/16/2018 1537   MCHC 30.7 06/18/2018 0122   RDW 16.9 (H) 09/16/2018 1537   LYMPHSABS 2.2 09/16/2018 1537   EOSABS 0.4 09/16/2018 1537   BASOSABS 0.1 09/16/2018 1537   Immunization History  Administered Date(s) Administered  . Influenza,inj,Quad PF,6+ Mos 02/10/2018  . Pneumococcal Polysaccharide-23 06/20/2018  . Tdap 05/01/2018   Assessment/Plan: Patient was counseled on the purpose, proper use, and adverse effects of hydroxychloroquine including nausea/diarrhea, skin rash, headaches, and sun sensitivity.  Discussed importance of annual eye exams while on hydroxychloroquine to monitor to ocular toxicity and discussed importance of frequent laboratory monitoring.  Provided patient with eye exam form for baseline ophthalmologic exam and standing lab instructions.  She reports she received the flu vaccine this season and  previously received Pneumovax 23.  Recommend Prevnar 13 vaccine.  Provided patient with educational materials on hydroxychloroquine and answered all questions.  Patient consented to hydroxychloroquine.  Will upload consent in the media tab.    Dose will be Plaquenil 200 mg twice daily Monday through Friday based on weight of 70.1 kg and height of 5\' 2" .     She will also start a prednisone 20 mg and taper by 5 mg every 7 days.  Counseled on the purpose, proper use, and adverse effects of prednisone including insomnia, increased appetite, increased blood sugar, and mood changes.  All questions encouraged and answered.  Instructed patient to call with any questions or concerns.  Mariella Saa, PharmD, [place], [place] Clinical Specialty Pharmacist [phone]  02/23/2019 10:41 AM

## 2019-02-23 NOTE — Patient Instructions (Addendum)
Standing Labs We placed an order today for your standing lab work.    Please come back and get your standing labs in 1 month, 3 months, then every 5 months.  We have open lab daily Monday through Thursday from 8:30-12:30 PM and 1:30-4:30 PM and Friday from 8:30-12:30 PM and 1:30-4:00 PM at the office of Dr. Bo Merino.   You may experience shorter wait times on Monday and Friday afternoons. The office is located at [address], [place], [place], [place] [address] No appointment is necessary.   Labs are drawn by [facility].  You may receive a bill from [facility] for your lab work.  If you wish to have your labs drawn at another location, please call the office 24 hours in advance to send orders.  If you have any questions regarding directions or hours of operation,  please call [phone].   Just as a reminder please drink plenty of water prior to coming for your lab work. Thanks!  Vaccines You are taking a medication(s) that can suppress your immune system.  The following immunizations are recommended: . Flu annually . Pneumonia (Pneumovax 23 and Prevnar 13 spaced at least 1 year apart)  Please check with your PCP to make sure you are up to date.   Hydroxychloroquine tablets What is this medicine? HYDROXYCHLOROQUINE (hye drox ee KLOR oh kwin) is used to treat rheumatoid arthritis and systemic lupus erythematosus. It is also used to treat malaria. This medicine may be used for other purposes; ask your health care provider or pharmacist if you have questions. COMMON BRAND NAME(S): Plaquenil, Quineprox What should I tell my health care provider before I take this medicine? They need to know if you have any of these conditions:  diabetes  eye disease, vision problems  G6PD deficiency  heart disease  history of irregular heartbeat  if you often drink alcohol  kidney disease  liver disease  porphyria  psoriasis  an unusual or allergic reaction to chloroquine,  hydroxychloroquine, other medicines, foods, dyes, or preservatives  pregnant or trying to get pregnant  breast-feeding How should I use this medicine? Take this medicine by mouth with a glass of water. Follow the directions on the prescription label. Do not cut, crush or chew this medicine. Swallow the tablets whole. Take this medicine with food. Avoid taking antacids within 4 hours of taking this medicine. It is best to separate these medicines by at least 4 hours. Take your medicine at regular intervals. Do not take it more often than directed. Take all of your medicine as directed even if you think you are better. Do not skip doses or stop your medicine early. Talk to your pediatrician regarding the use of this medicine in children. While this drug may be prescribed for selected conditions, precautions do apply. Overdosage: If you think you have taken too much of this medicine contact a poison control center or emergency room at once. NOTE: This medicine is only for you. Do not share this medicine with others. What if I miss a dose? If you miss a dose, take it as soon as you can. If it is almost time for your next dose, take only that dose. Do not take double or extra doses. What may interact with this medicine? Do not take this medicine with any of the following medications:  cisapride  dronedarone  pimozide  thioridazine This medicine may also interact with the following medications:  ampicillin  antacids  cimetidine  cyclosporine  digoxin  kaolin  medicines for diabetes, like insulin, glipizide, glyburide  medicines for seizures like carbamazepine, phenobarbital, phenytoin  mefloquine  methotrexate  other medicines that prolong the QT interval (cause an abnormal heart rhythm)  praziquantel This list may not describe all possible interactions. Give your health care provider a list of all the medicines, herbs, non-prescription drugs, or dietary supplements you use.  Also tell them if you smoke, drink alcohol, or use illegal drugs. Some items may interact with your medicine. What should I watch for while using this medicine? Visit your health care professional for regular checks on your progress. Tell your health care professional if your symptoms do not start to get better or if they get worse. You may need blood work done while you are taking this medicine. If you take other medicines that can affect heart rhythm, you may need more testing. Talk to your health care professional if you have questions. Your vision may be tested before and during use of this medicine. Tell your health care professional right away if you have any change in your eyesight. What side effects may I notice from receiving this medicine? Side effects that you should report to your doctor or health care professional as soon as possible:  allergic reactions like skin rash, itching or hives, swelling of the face, lips, or tongue  changes in vision  decreased hearing or ringing of the ears  muscle weakness  redness, blistering, peeling or loosening of the skin, including inside the mouth  sensitivity to light  signs and symptoms of a dangerous change in heartbeat or heart rhythm like chest pain; dizziness; fast or irregular heartbeat; palpitations; feeling faint or lightheaded, falls; breathing problems  signs and symptoms of liver injury like dark yellow or brown urine; general ill feeling or flu-like symptoms; light-colored stools; loss of appetite; nausea; right upper belly pain; unusually weak or tired; yellowing of the eyes or skin  signs and symptoms of low blood sugar such as feeling anxious; confusion; dizziness; increased hunger; unusually weak or tired; sweating; shakiness; cold; irritable; headache; blurred vision; fast heartbeat; loss of consciousness  suicidal thoughts  uncontrollable head, mouth, neck, arm, or leg movements Side effects that usually do not require  medical attention (report to your doctor or health care professional if they continue or are bothersome):  diarrhea  dizziness  hair loss  headache  irritable  loss of appetite  nausea, vomiting  stomach pain This list may not describe all possible side effects. Call your doctor for medical advice about side effects. You may report side effects to FDA at 1-800-FDA-1088. Where should I keep my medicine? Keep out of the reach of children. Store at room temperature between 15 and 30 degrees C (59 and 86 degrees F). Protect from moisture and light. Throw away any unused medicine after the expiration date. NOTE: This sheet is a summary. It may not cover all possible information. If you have questions about this medicine, talk to your doctor, pharmacist, or health care provider.  2020 Elsevier/Gold Standard (2018-08-18 12:56:32)

## 2019-02-27 LAB — PROTEIN ELECTROPHORESIS, SERUM, WITH REFLEX
Albumin ELP: 4.6 g/dL (ref 3.8–4.8)
Alpha 1: 0.4 g/dL — ABNORMAL HIGH (ref 0.2–0.3)
Alpha 2: 0.9 g/dL (ref 0.5–0.9)
Beta 2: 0.4 g/dL (ref 0.2–0.5)
Beta Globulin: 0.4 g/dL (ref 0.4–0.6)
Gamma Globulin: 0.7 g/dL — ABNORMAL LOW (ref 0.8–1.7)
Total Protein: 7.4 g/dL (ref 6.1–8.1)

## 2019-02-27 LAB — URINALYSIS, ROUTINE W REFLEX MICROSCOPIC
Bacteria, UA: NONE SEEN /HPF
Bilirubin Urine: NEGATIVE
Glucose, UA: NEGATIVE
Hgb urine dipstick: NEGATIVE
Hyaline Cast: NONE SEEN /LPF
Ketones, ur: NEGATIVE
Nitrite: NEGATIVE
Protein, ur: NEGATIVE
RBC / HPF: NONE SEEN /HPF (ref 0–2)
Specific Gravity, Urine: 1.012 (ref 1.001–1.03)
pH: 6.5 (ref 5.0–8.0)

## 2019-02-27 LAB — SJOGRENS SYNDROME-B EXTRACTABLE NUCLEAR ANTIBODY: SSB (La) (ENA) Antibody, IgG: <1 AI

## 2019-02-27 LAB — CK: Total CK: 53 U/L (ref 29–143)

## 2019-02-27 LAB — QUANTIFERON-TB GOLD PLUS
Mitogen-NIL: >10 IU/mL
NIL: 0.03 IU/mL
QuantiFERON-TB Gold Plus: NEGATIVE
TB1-NIL: 0 IU/mL
TB2-NIL: 0 IU/mL

## 2019-02-27 LAB — CBC WITH DIFFERENTIAL/PLATELET
Absolute Monocytes: 763 cells/uL (ref 200–950)
Basophils Absolute: 65 cells/uL (ref 0–200)
Basophils Relative: 0.6 %
Eosinophils Absolute: 469 cells/uL (ref 15–500)
Eosinophils Relative: 4.3 %
HCT: 41.6 % (ref 35.0–45.0)
Hemoglobin: 13.9 g/dL (ref 11.7–15.5)
Lymphs Abs: 2725 cells/uL (ref 850–3900)
MCH: 28.7 pg (ref 27.0–33.0)
MCHC: 33.4 g/dL (ref 32.0–36.0)
MCV: 85.8 fL (ref 80.0–100.0)
MPV: 10.1 fL (ref 7.5–12.5)
Monocytes Relative: 7 %
Neutro Abs: 6878 cells/uL (ref 1500–7800)
Neutrophils Relative %: 63.1 %
Platelets: 351 10*3/uL (ref 140–400)
RBC: 4.85 10*6/uL (ref 3.80–5.10)
RDW: 14.3 % (ref 11.0–15.0)
Total Lymphocyte: 25 %
WBC: 10.9 10*3/uL — ABNORMAL HIGH (ref 3.8–10.8)

## 2019-02-27 LAB — HEPATITIS C ANTIBODY
Hepatitis C Ab: NONREACTIVE
SIGNAL TO CUT-OFF: 0.01 (ref <1.00)

## 2019-02-27 LAB — CARDIOLIPIN ANTIBODIES, IGG, IGM, IGA
Anticardiolipin IgA: <11 [APL'U]
Anticardiolipin IgG: <14 [GPL'U]
Anticardiolipin IgM: <12 [MPL'U]

## 2019-02-27 LAB — 14-3-3 ETA PROTEIN: 14-3-3 eta Protein: <0.2 ng/mL (ref <0.2)

## 2019-02-27 LAB — LUPUS ANTICOAGULANT EVAL W/ REFLEX
PTT-LA Screen: 34 s (ref <=40)
dRVVT: 28 s (ref <=45)

## 2019-02-27 LAB — BETA-2 GLYCOPROTEIN ANTIBODIES
Beta-2 Glyco 1 IgA: <9 SAU (ref <=20)
Beta-2 Glyco 1 IgM: <9 SMU (ref <=20)
Beta-2 Glyco I IgG: <9 SGU (ref <=20)

## 2019-02-27 LAB — COMPLETE METABOLIC PANEL WITH GFR
AG Ratio: 1.8 (calc) (ref 1.0–2.5)
ALT: 19 U/L (ref 6–29)
AST: 16 U/L (ref 10–30)
Albumin: 4.8 g/dL (ref 3.6–5.1)
Alkaline phosphatase (APISO): 96 U/L (ref 31–125)
BUN: 13 mg/dL (ref 7–25)
CO2: 21 mmol/L (ref 20–32)
Calcium: 9.7 mg/dL (ref 8.6–10.2)
Chloride: 103 mmol/L (ref 98–110)
Creat: 0.64 mg/dL (ref 0.50–1.10)
GFR, Est African American: 138 mL/min/{1.73_m2} (ref >=60)
GFR, Est Non African American: 119 mL/min/{1.73_m2} (ref >=60)
Globulin: 2.7 g/dL (calc) (ref 1.9–3.7)
Glucose, Bld: 89 mg/dL (ref 65–99)
Potassium: 4.3 mmol/L (ref 3.5–5.3)
Sodium: 138 mmol/L (ref 135–146)
Total Bilirubin: 0.3 mg/dL (ref 0.2–1.2)
Total Protein: 7.5 g/dL (ref 6.1–8.1)

## 2019-02-27 LAB — HIV ANTIBODY (ROUTINE TESTING W REFLEX): HIV 1&2 Ab, 4th Generation: NONREACTIVE

## 2019-02-27 LAB — HEPATITIS B SURFACE ANTIGEN: Hepatitis B Surface Ag: NONREACTIVE

## 2019-02-27 LAB — IGG, IGA, IGM
IgG (Immunoglobin G), Serum: 636 mg/dL (ref 600–1640)
IgM, Serum: 113 mg/dL (ref 50–300)
Immunoglobulin A: 162 mg/dL (ref 47–310)

## 2019-02-27 LAB — ANA: Anti Nuclear Antibody (ANA): POSITIVE — AB

## 2019-02-27 LAB — ANTI-DNA ANTIBODY, DOUBLE-STRANDED: ds DNA Ab: 1 IU/mL

## 2019-02-27 LAB — GLUCOSE 6 PHOSPHATE DEHYDROGENASE: G-6PDH: 17.3 U/g Hgb (ref 7.0–20.5)

## 2019-02-27 LAB — C3 AND C4
C3 Complement: 167 mg/dL (ref 83–193)
C4 Complement: 47 mg/dL (ref 15–57)

## 2019-02-27 LAB — SEDIMENTATION RATE: Sed Rate: 25 mm/h — ABNORMAL HIGH (ref 0–20)

## 2019-02-27 LAB — RNP ANTIBODY: Ribonucleic Protein(ENA) Antibody, IgG: <1 AI

## 2019-02-27 LAB — ANTI-NUCLEAR AB-TITER (ANA TITER): ANA Titer 1: 1:80 {titer} — ABNORMAL HIGH

## 2019-02-27 LAB — ANTI-SCLERODERMA ANTIBODY: Scleroderma (Scl-70) (ENA) Antibody, IgG: <1 AI

## 2019-02-27 LAB — HEPATITIS B CORE ANTIBODY, IGM: Hep B C IgM: NONREACTIVE

## 2019-02-27 LAB — ANTI-SMITH ANTIBODY: ENA SM Ab Ser-aCnc: <1 AI

## 2019-02-27 LAB — SJOGRENS SYNDROME-A EXTRACTABLE NUCLEAR ANTIBODY: SSA (Ro) (ENA) Antibody, IgG: <1 AI

## 2019-02-27 NOTE — Progress Notes (Signed)
WBC count is mildly elevated due to prednisone use.  All other labs are stable.  ANA is positive at low titer.  Patient was placed on Plaquenil at the last visit.  I will discuss labs at length at the follow-up visit.

## 2019-03-09 IMAGING — US US MFM OB FOLLOW-UP
1 series · 13 of 28 positions shown · non-contrast
Comparison: none

[Series 1: us mfm ob follow-up · 81 acquisitions, 13 frames shown]
[im 3/81]
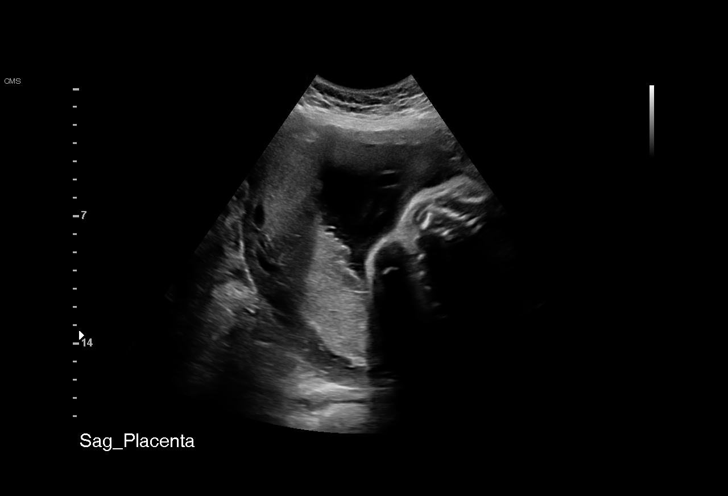
[im 9/81]
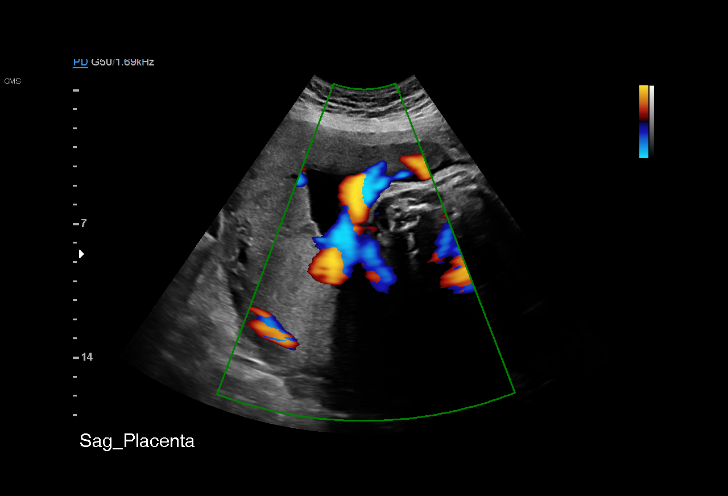
[im 15/81]
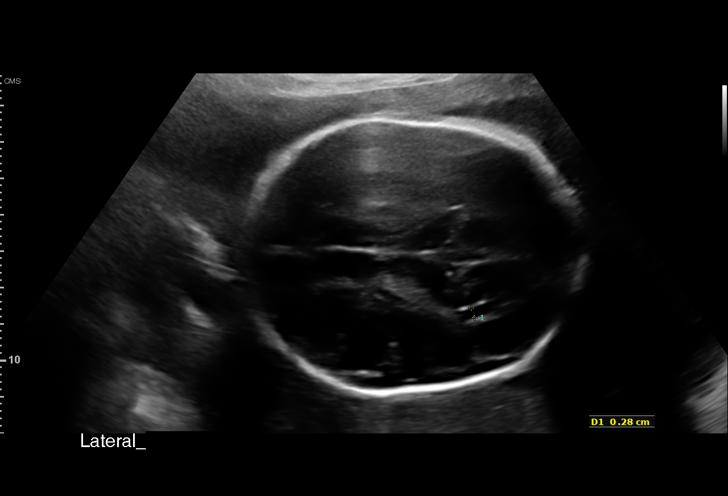
[im 21/81]
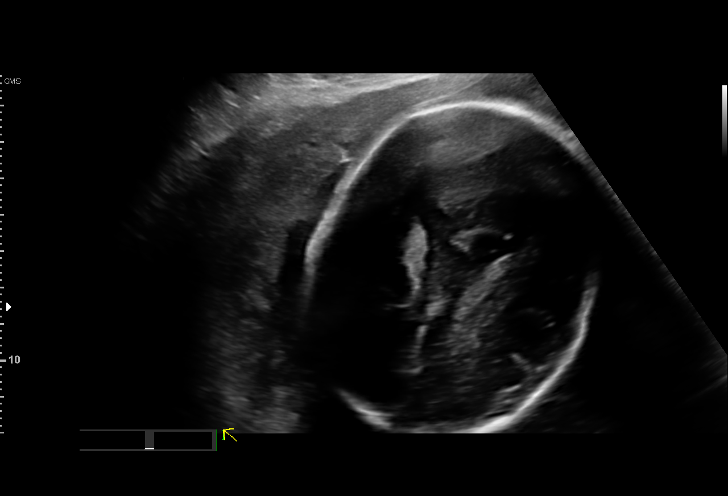
[im 27/81]
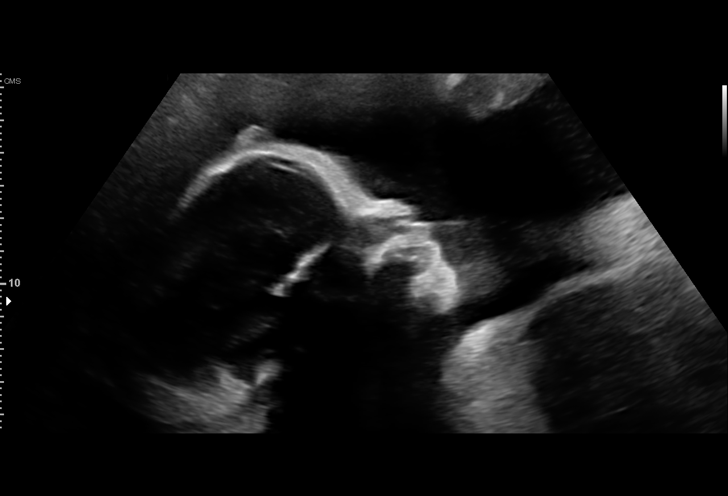
[im 33/81]
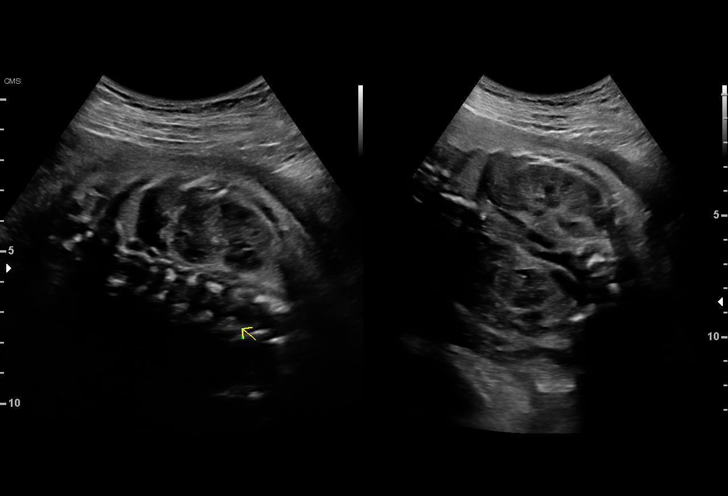
[im 42/81]
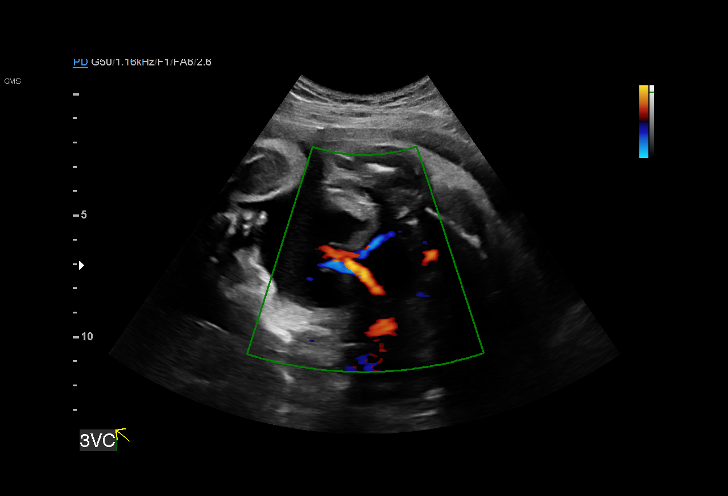
[im 48/81]
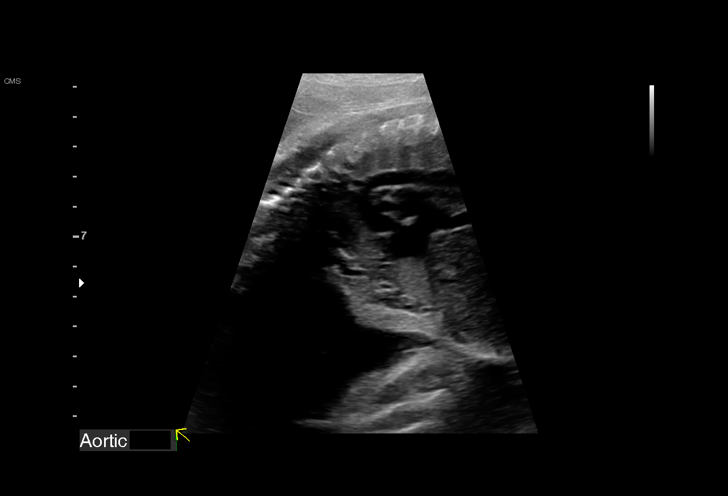
[im 54/81]
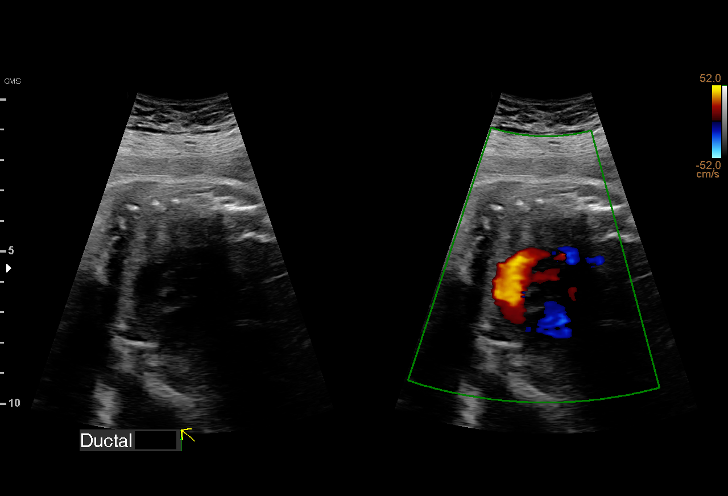
[im 60/81]
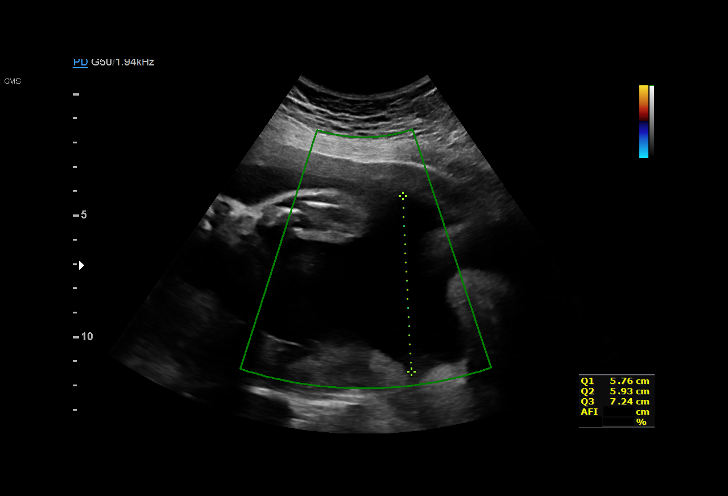
[im 66/81]
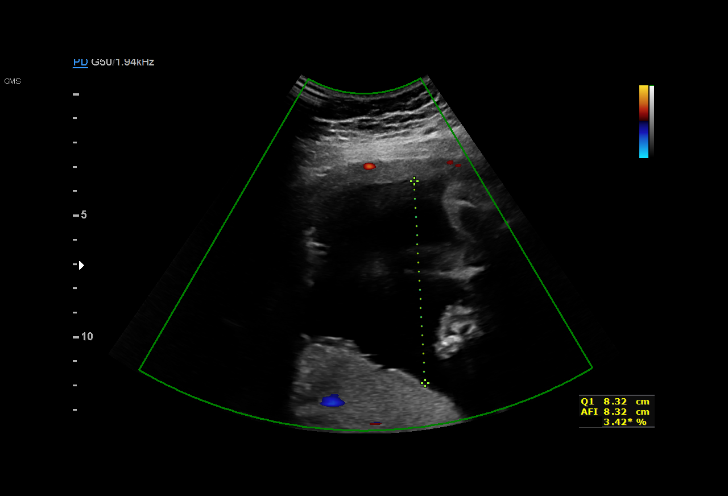
[im 72/81]
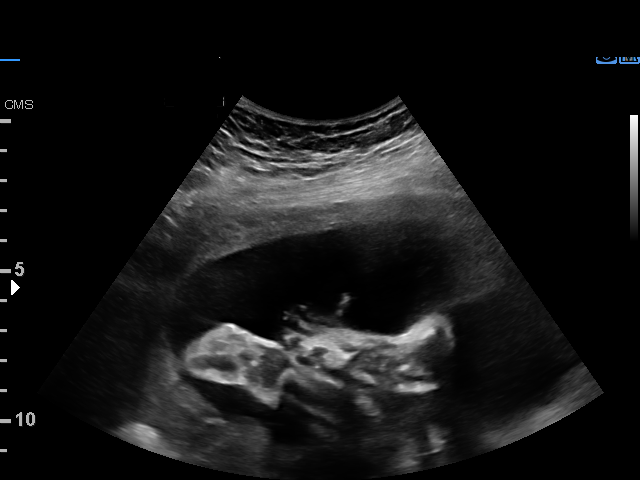
[im 78/81]
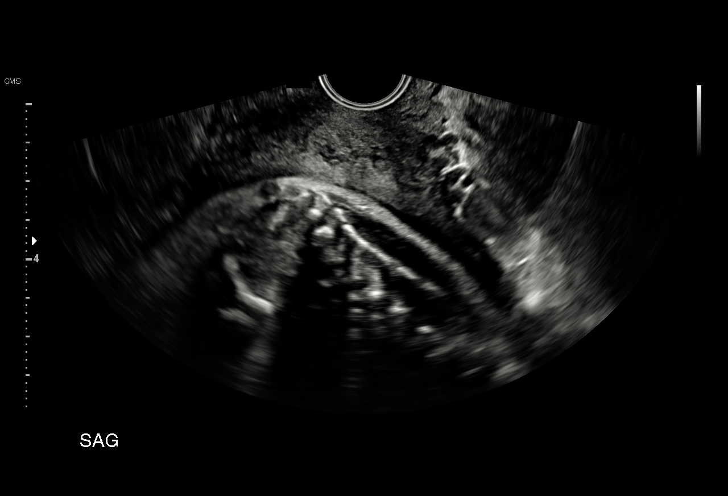

[13 of 28 positions shown; findings below may reference images not displayed]

----------------------------------------------------------------------

 ----------------------------------------------------------------------
Indications

  Antenatal follow-up for nonvisualized fetal
  anatomy
  Poor obstetric history: Previous
  preeclampsia / eclampsia/gestational HTN
  30 weeks gestation of pregnancy
  Encounter for cervical length
 ----------------------------------------------------------------------
Fetal Evaluation

 Num Of Fetuses:         1
 Fetal Heart Rate(bpm):  137
 Cardiac Activity:       Observed
 Presentation:           Breech
 Placenta:               Posterior
 P. Cord Insertion:      Visualized

 Amniotic Fluid
 AFI FV:      Subjectively upper-normal

 AFI Sum(cm)     %Tile       Largest Pocket(cm)
 22.51           90

 RUQ(cm)       RLQ(cm)       LUQ(cm)        LLQ(cm)

Biometry

 BPD:      75.7  mm     G. Age:  30w 3d         36  %    CI:        67.47   %    70 - 86
                                                         FL/HC:      18.8   %    19.2 -
 HC:       295   mm     G. Age:  32w 4d         76  %    HC/AC:      1.04        0.99 -
 AC:      284.6  mm     G. Age:  32w 3d         93  %    FL/BPD:     73.3   %    71 - 87
 FL:       55.5  mm     G. Age:  29w 2d         10  %    FL/AC:      19.5   %    20 - 24
 HUM:      48.8  mm     G. Age:  28w 5d         16  %

 Est. FW:    5247  gm    3 lb 14 oz      72  %
OB History

 Gravidity:    3         Term:   1         SAB:   1
 Living:       1
Gestational Age

 LMP:           30w 3d        Date:  09/10/17                 EDD:   06/17/18
 U/S Today:     31w 1d                                        EDD:   06/12/18
 Best:          30w 3d     Det. By:  LMP  (09/10/17)          EDD:   06/17/18
Anatomy

 Cranium:               Appears normal         Aortic Arch:            Appears normal
 Cavum:                 Appears normal         Ductal Arch:            Appears normal
 Ventricles:            Appears normal         Diaphragm:              Previously seen
 Choroid Plexus:        Appears normal         Stomach:                Appears normal, left
                                                                       sided
 Cerebellum:            Appears normal         Abdomen:                Appears normal
 Posterior Fossa:       Appears normal         Abdominal Wall:         Previously seen
 Nuchal Fold:           Not applicable (>20    Cord Vessels:           Previously seen
                        wks GA)
 Face:                  Appears normal         Kidneys:                Appear normal
                        (orbits and profile)
 Lips:                  Appears normal         Bladder:                Appears normal
 Thoracic:              Appears normal         Spine:                  Appears normal
 Heart:                 Appears normal         Upper Extremities:      Previously seen
                        (4CH, axis, and
                        situs)
 RVOT:                  Previously seen        Lower Extremities:      Previously seen
 LVOT:                  Previously seen

 Other:  Heels previously visualized. Technically difficult due to fetal position.
Cervix Uterus Adnexa

 Cervix
 Length:              3  cm.
 Measured transvaginally.

 Uterus
 No abnormality visualized.

 Left Ovary
 Within normal limits.

 Right Ovary
 Within normal limits.

 Cul De Sac
 No free fluid seen.

 Adnexa
 No abnormality visualized.
Impression

 Fetal growth is appropriate for gestational age. Amniotic fluid
 is normal and good fetal activity is seen. We performed
 transvaginal ultrasound to evaluate the lumbosacral spine,
 which appears normal.

 We reassured the patient of the findings.

 I encouraged her to screen for gestational diabetes.
Recommendations

 Follow-up as clinically indicated.
                      Perezz, Pratik

## 2019-03-13 NOTE — Progress Notes (Deleted)
Office Visit Note  Patient: Lisa Hall             Date of Birth: 12/18/1985           MRN: [id number]             PCP: Baruch Gouty, FNP Referring: Baruch Gouty, FNP Visit Date: 03/26/2019 Occupation: '@GUAROCC'$ @  Subjective:  No chief complaint on file.   History of Present Illness: Lisa Hall is a 33 y.o. female ***   Activities of Daily Living:  Patient reports morning stiffness for *** {minute/hour:19697}.   Patient {ACTIONS;DENIES/REPORTS:21021675::"Denies"} nocturnal pain.  Difficulty dressing/grooming: {ACTIONS;DENIES/REPORTS:21021675::"Denies"} Difficulty climbing stairs: {ACTIONS;DENIES/REPORTS:21021675::"Denies"} Difficulty getting out of chair: {ACTIONS;DENIES/REPORTS:21021675::"Denies"} Difficulty using hands for taps, buttons, cutlery, and/or writing: {ACTIONS;DENIES/REPORTS:21021675::"Denies"}  No Rheumatology ROS completed.   PMFS History:  Patient Active Problem List   Diagnosis Date Noted  . Mixed hyperlipidemia 11/20/2018  . Rheumatoid arthritis ([place]) 11/20/2018  . Chronic pain of both knees 11/20/2018  . Gastroesophageal reflux disease without esophagitis 09/16/2018  . BMI 29.0-29.9,adult 09/16/2018  . Abnormal glucose tolerance in pregnancy 05/06/2018  . History of pre-eclampsia 02/10/2018    Past Medical History:  Diagnosis Date  . Elevated BP without diagnosis of hypertension 06/18/2018  . Preeclampsia   . Rheumatoid arthritis ([place])     Family History  Problem Relation Age of Onset  . Hypertension Mother   . Diabetes Mother    Past Surgical History:  Procedure Laterality Date  . DILATION AND CURETTAGE OF UTERUS     Social History   Social History Narrative  . Not on file   Immunization History  Administered Date(s) Administered  . Influenza,inj,Quad PF,6+ Mos 02/10/2018  . Pneumococcal Polysaccharide-23 06/20/2018  . Tdap 05/01/2018     Objective: Vital Signs: There were no vitals taken for this visit.   Physical Exam    Musculoskeletal Exam: ***  CDAI Exam: CDAI Score: - Patient Global: -; Provider Global: - Swollen: -; Tender: - Joint Exam   No joint exam has been documented for this visit   There is currently no information documented on the homunculus. Go to the Rheumatology activity and complete the homunculus joint exam.  Investigation: No additional findings.  Imaging: Xr Hips Bilat W Or W/o Pelvis 3-4 Views  Result Date: 02/23/2019 No hip joint narrowing was noted.  No chondrocalcinosis was noted.  SI joints are within normal limits. Impression: Unremarkable x-ray of the hip joints.  Xr Ankle 2 Views Left  Result Date: 02/23/2019 No tibiotalar or subtalar joint space narrowing was noted.  Soft tissue swelling was noted. Impression: Unremarkable x-ray of the ankle joint.  Xr Foot 2 Views Left  Result Date: 02/23/2019 PIP and DIP narrowing was noted.  No MTP, intertarsal joint space narrowing was noted.  No erosive changes were noted. Impression: These findings are consistent with osteoarthritis of the foot.  Xr Foot 2 Views Right  Result Date: 02/23/2019 PIP and DIP narrowing was noted.  No MTP, intertarsal joint space narrowing was noted.  No erosive changes were noted. Impression: These findings are consistent with osteoarthritis of the foot.  Xr Hand 2 View Left  Result Date: 02/23/2019 No MCP PIP or DIP narrowing was noted.  No intercarpal radiocarpal joint space narrowing was noted.  No erosive changes were noted. Impression: Unremarkable x-ray of the hand.  Xr Hand 2 View Right  Result Date: 02/23/2019 No MCP PIP or DIP narrowing was noted.  No intercarpal radiocarpal joint space narrowing was noted.  No erosive changes were noted. Impression: Unremarkable x-ray of the hand.  Xr Knee 3 View Right  Result Date: 02/23/2019 No medial lateral compartment narrowing was noted.  No chondrocalcinosis was noted.  Mild patellofemoral narrowing was noted. 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   Recent Labs: Lab Results  Component Value Date   WBC 10.9 (H) 02/23/2019   HGB 13.9 02/23/2019   PLT 351 02/23/2019   NA 138 02/23/2019   K 4.3 02/23/2019   CL 103 02/23/2019   CO2 21 02/23/2019   GLUCOSE 89 02/23/2019   BUN 13 02/23/2019   CREATININE 0.64 02/23/2019   BILITOT 0.3 02/23/2019   ALKPHOS 107 09/16/2018   AST 16 02/23/2019   ALT 19 02/23/2019   PROT 7.5 02/23/2019   PROT 7.4 02/23/2019   ALBUMIN 4.4 09/16/2018   CALCIUM 9.7 02/23/2019   GFRAA 138 02/23/2019   QFTBGOLDPLUS NEGATIVE 02/23/2019  February 23, 2019 UA negative, CK 53, hepatitis B-, hepatitis C negative, HIV negative, G6PD normal, SPEP nonspecific, immunoglobulins normal, TB Gold negative  ANA 1: 80 speckled, ENA negative, C3-C4 normal, anticardiolipin negative, beta-2 negative, lupus anticoagulant negative, '14 3 3 '$ eta negative, ESR 25  09/16/18: ANA 1:160 H, 1:80 speckled, RF<10, CCP 7, TSH 0.704  Speciality Comments: No specialty comments available.  Procedures:  No procedures performed Allergies: Patient has no known allergies.   Assessment / Plan:     Visit Diagnoses: No diagnosis found.  Orders: No orders of the defined types were placed in this encounter.  No orders of the defined types were placed in this encounter.   Face-to-face time spent with patient was *** minutes. Greater than 50% of time was spent in counseling and coordination of care.  Follow-Up Instructions: No follow-ups on file.   Bo Merino, MD  Note - This record has been created using Editor, commissioning.  Chart creation errors have been sought, but may not always  have been located. Such creation errors do not reflect on  the standard of medical care.

## 2019-03-23 ENCOUNTER — Telehealth: Payer: Self-pay | Admitting: Rheumatology

## 2019-03-23 NOTE — Telephone Encounter (Signed)
Patient called stating she began taking Plaquenil a few weeks ago and on Thursday, 03/19/19 the bottom of her feet are red and painful.  Patient states it feels like "she is standing on hot sand."   Patient requested a return call on Wednesday, 03/26/19.

## 2019-03-23 NOTE — Telephone Encounter (Signed)
Please advise 

## 2019-03-24 NOTE — Telephone Encounter (Signed)
Please advise patient to send [place] a picture through mychart. Has she noticed any skin peeling?  This is not a typical side effect of Plaquenil.

## 2019-03-24 NOTE — Telephone Encounter (Signed)
Patient states she will upload pictures in [place] of her feet. Patient states at the end of the day, her feet are "blood red" and very painful, patient denies skin peeling. Patient is on her feet all day at work as well.

## 2019-03-25 ENCOUNTER — Encounter: Payer: Self-pay | Admitting: Rheumatology

## 2019-03-25 MED ORDER — PREDNISONE 5 MG PO TABS: ORAL_TABLET | ORAL | 0 refills | Status: DC

## 2019-03-25 NOTE — Telephone Encounter (Signed)
Prednisone taper has been sent to the pharmacy, patient is aware. Scheduled patient for an appointment the first week of January 2021. Advised patient to call the office once taper is almost completed for another prednisone prescription of 5mg  po qd, patient verbalized understanding.

## 2019-03-25 NOTE — Telephone Encounter (Signed)
Patient appears to be having an allergic response to Plaquenil.  I have advised her to discontinue Plaquenil.  She will take Benadryl 25 mg every 6 hours as needed.  I would also call in some prednisone starting at 20 mg p.o. daily for 4 days and then taper by 5 mg every 4 days.  She will stay on prednisone 5 mg p.o. daily until her follow-up visit.  She currently has no insurance and will not get insurance until the first week of January.  Please schedule an appointment in the first week of January.  At that time we can obtain blood work and discuss other treatment options.

## 2019-03-25 NOTE — Telephone Encounter (Signed)
Please advise 

## 2019-03-26 ENCOUNTER — Ambulatory Visit: Payer: Medicaid Other | Admitting: Rheumatology

## 2019-04-27 NOTE — Progress Notes (Signed)
Virtual Visit via Telephone Note  I connected with [employer] on 04/28/19 at  1:00 PM EST by telephone and verified that I am speaking with the correct person using two identifiers.  Location: Patient: Home Provider: Clinic  This service was conducted via virtual visit.  The patient was located at home. I was located in my office.  Consent was obtained prior to the virtual visit and is aware of possible charges through their insurance for this visit.  The patient is an established patient.  Dr. Estanislado Pandy, MD conducted the virtual visit and Hazel Sams, PA-C acted as scribe during the service.  Office staff helped with scheduling follow up visits after the service was conducted.     I discussed the limitations, risks, security and privacy concerns of performing an evaluation and management service by telephone and the availability of in person appointments. I also discussed with the patient that there may be a patient responsible charge related to this service. The patient expressed understanding and agreed to proceed.  CC: Pain in multiple joints  History of Present Illness: Patient is a 34 year old female with past medical history of autoimmune disease. She discontinued PLQ on 03/25/19 due to developing a rash.  She was started on a prednisone taper on 03/25/19.  She has persistent pain and swelling in the right knee joint and left ankle joint.  She states the pain in her right hand has subsided. She is having severe hip pain bilaterally and pelvic floor pain according to the patient.   Review of Systems  Constitutional: Positive for malaise/fatigue. Negative for fever.  HENT: Negative for congestion.   Eyes: Negative for photophobia, pain, discharge and redness.  Respiratory: Negative for cough, shortness of breath and wheezing.   Cardiovascular: Negative for chest pain and palpitations.  Gastrointestinal: Positive for constipation. Negative for blood in stool and diarrhea.  Genitourinary:  Negative for dysuria.  Musculoskeletal: Positive for joint pain. Negative for back pain, myalgias and neck pain.  Skin: Negative for rash.  Neurological: Negative for dizziness, weakness and headaches.  Psychiatric/Behavioral: Negative for depression. The patient is not nervous/anxious and does not have insomnia.       Observations/Objective: Physical Exam  Constitutional: She is oriented to person, place, and time.  Neurological: She is alert and oriented to person, place, and time.  Psychiatric: Mood, memory, affect and judgment normal.    Patient reports morning stiffness for 1 hour.   Patient reports nocturnal pain.  Difficulty dressing/grooming: Reports Difficulty climbing stairs: Reports Difficulty getting out of chair: Reports Difficulty using hands for taps, buttons, cutlery, and/or writing: Reports   02/23/19: ANA 1:80 Nuclear, dense fine speckled, ESR 25, ENA negative, 14-3-3 eta negative, complements WNL, anticardiolipin negative, Beta-2 negative, LA not detected, TB gold negative, immunoglobulins WNL, Hep C negative, Hep B negative, HIV negative, SPEP-no abnormal protein bands.   She currently rates her RA a 6/10.    Assessment and Plan: Visit Diagnoses: Seronegative rheumatoid arthritis (HCC)-Hx of inflammatory arthritis for the last 6 months. 02/23/19: ANA 1:80 Nuclear, dense fine speckled, ESR 25, ENA negative, 14-3-3 eta negative, complements WNL, anticardiolipin negative, Beta-2 negative, LA not detected, CK 53, RF-: She has persistent pain and inflammation in the right knee joint.  She has intermittent discomfort and joint swelling in the left ankle joint.  She is currently having pain in both hip joints.  We reviewed lab work obtained on 02/23/19 and all questions were addressed. She tried taking plaquenil on 03/25/19 but discontinued  after developing a rash. A prednisone taper was sent to the pharmacy on 03/25/19.  She completed the prednisone taper last week and her  symptoms returned. We discussed starting her on methotrexate 6 tablets by mouth once weekly x2 weeks and if labs are stable she will increase to 8 tablets by mouth once weekly.  She will take folic acid 2 mg po daily.  Indications, contraindications, and potential side effects of MTX were discussed.  All questions were answered.  She will come by the office to sign the consent form.  She will require lab work in 2 weeks x2, 2 months, then every 3 months after starting on MTX.  We will send in a prednisone taper starting at 10 mg x2 weeks, 7.5 mg x2 weeks, 5 mg x2 weeks, and 2.5 mg x2 weeks.  She was advised to notify us if she develops increased joint pain and inflammation after the prednisone taper is complete.  She will follow up in 3-4 weeks.   High risk medication use: MTX 6 tablets po once weekly x2 weeks and if labs are stable she will increase to 8 tablets po once weekly and folic acid 2 mg po daily.  She will return for lab work in 2 weeks x2, 2 months, then every 3 months.  She will require a baseline CXR prior to starting on MTX.    D/c Plaquenil-Rash.  Drug Counseling TB Gold: negative 02/23/19 Hepatitis panel: Negative 02/23/19  Chest-xray:  Ordered   Contraception:She is not planning pregnancy. Her husband had a vasectomy.   Alcohol use: She rarely drinks alcohol.  We discussed the importance of avoiding alcohol use.  Patient was counseled on the purpose, proper use, and adverse effects of methotrexate including nausea, infection, and signs and symptoms of pneumonitis.  Reviewed instructions with patient to take methotrexate weekly along with folic acid daily.  Discussed the importance of frequent monitoring of kidney and liver function and blood counts, and provided patient with standing lab instructions.  Counseled patient to avoid NSAIDs and alcohol while on methotrexate.  Provided patient with educational materials on methotrexate and answered all questions.  Advised patient to get  annual influenza vaccine and to get a pneumococcal vaccine if patient has not already had one.  Patient voiced understanding.  Patient consented to methotrexate use.  Will upload into chart.    Chronic pain of right knee - The x-ray showed mild chondromalacia patella.  She has significant pain and inflammation in the right knee joint currently.   Primary osteoarthritis of both feet: She is not having any feet pain or inflammation at this time.  She has intermittent left ankle joint pain and inflammation.    Other fatigue: Chronic    Other medical conditions are listed as follows:   Mixed hyperlipidemia  Gastroesophageal reflux disease without esophagitis  History of pre-eclampsia  Follow Up Instructions: She will follow up in 3-4 weeks   I discussed the assessment and treatment plan with the patient. The patient was provided an opportunity to ask questions and all were answered. The patient agreed with the plan and demonstrated an understanding of the instructions.   The patient was advised to call back or seek an in-person evaluation if the symptoms worsen or if the condition fails to improve as anticipated.  I provided 30 minutes of non-face-to-face time during this encounter.   Bo Merino, MD   Scribed by-  Hazel Sams, PA-C

## 2019-04-28 ENCOUNTER — Telehealth (INDEPENDENT_AMBULATORY_CARE_PROVIDER_SITE_OTHER): Payer: Medicaid Other | Admitting: Rheumatology

## 2019-04-28 ENCOUNTER — Other Ambulatory Visit: Payer: Self-pay | Admitting: *Deleted

## 2019-04-28 ENCOUNTER — Other Ambulatory Visit: Payer: Self-pay

## 2019-04-28 ENCOUNTER — Encounter: Payer: Self-pay | Admitting: Rheumatology

## 2019-04-28 DIAGNOSIS — R5383 Other fatigue: Secondary | ICD-10-CM

## 2019-04-28 DIAGNOSIS — M0600 Rheumatoid arthritis without rheumatoid factor, unspecified site: Secondary | ICD-10-CM

## 2019-04-28 DIAGNOSIS — Z79899 Other long term (current) drug therapy: Secondary | ICD-10-CM

## 2019-04-28 DIAGNOSIS — K219 Gastro-esophageal reflux disease without esophagitis: Secondary | ICD-10-CM

## 2019-04-28 DIAGNOSIS — Z8759 Personal history of other complications of pregnancy, childbirth and the puerperium: Secondary | ICD-10-CM

## 2019-04-28 DIAGNOSIS — E782 Mixed hyperlipidemia: Secondary | ICD-10-CM | POA: Diagnosis not present

## 2019-04-28 MED ORDER — PREDNISONE 5 MG PO TABS: ORAL_TABLET | ORAL | 0 refills | Status: AC

## 2019-04-28 NOTE — Patient Instructions (Addendum)
Methotrexate tablets What is this medicine? METHOTREXATE (METH oh TREX ate) is a chemotherapy drug used to treat cancer including breast cancer, leukemia, and lymphoma. This medicine can also be used to treat psoriasis and certain kinds of arthritis. This medicine may be used for other purposes; ask your health care provider or pharmacist if you have questions. COMMON BRAND NAME(S): Rheumatrex, Trexall What should I tell my health care provider before I take this medicine? They need to know if you have any of these conditions:  fluid in the stomach area or lungs  if you often drink alcohol  infection or immune system problems  kidney disease or on hemodialysis  liver disease  low blood counts, like low white cell, platelet, or red cell counts  lung disease  radiation therapy  stomach ulcers  ulcerative colitis  an unusual or allergic reaction to methotrexate, other medicines, foods, dyes, or preservatives  pregnant or trying to get pregnant  breast-feeding How should I use this medicine? Take this medicine by mouth with a glass of water. Follow the directions on the prescription label. Take your medicine at regular intervals. Do not take it more often than directed. Do not stop taking except on your doctor's advice. Make sure you know why you are taking this medicine and how often you should take it. If this medicine is used for a condition that is not cancer, like arthritis or psoriasis, it should be taken weekly, NOT daily. Taking this medicine more often than directed can cause serious side effects, even death. Talk to your healthcare provider about safe handling and disposal of this medicine. You may need to take special precautions. Talk to your pediatrician regarding the use of this medicine in children. While this drug may be prescribed for selected conditions, precautions do apply. Overdosage: If you think you have taken too much of this medicine contact a poison control  center or emergency room at once. NOTE: This medicine is only for you. Do not share this medicine with others. What if I miss a dose? If you miss a dose, talk with your doctor or health care professional. Do not take double or extra doses. What may interact with this medicine? This medicine may interact with the following medication:  acitretin  aspirin and aspirin-like medicines including salicylates  azathioprine  certain antibiotics like penicillins, tetracycline, and chloramphenicol  cyclosporine  gold  hydroxychloroquine  live virus vaccines  NSAIDs, medicines for pain and inflammation, like ibuprofen or naproxen  other cytotoxic agents  penicillamine  phenylbutazone  phenytoin  probenecid  retinoids such as isotretinoin and tretinoin  steroid medicines like prednisone or cortisone  sulfonamides like sulfasalazine and trimethoprim/sulfamethoxazole  theophylline This list may not describe all possible interactions. Give your health care provider a list of all the medicines, herbs, non-prescription drugs, or dietary supplements you use. Also tell them if you smoke, drink alcohol, or use illegal drugs. Some items may interact with your medicine. What should I watch for while using this medicine? Avoid alcoholic drinks. This medicine can make you more sensitive to the sun. Keep out of the sun. If you cannot avoid being in the sun, wear protective clothing and use sunscreen. Do not use sun lamps or tanning beds/booths. You may need blood work done while you are taking this medicine. Call your doctor or health care professional for advice if you get a fever, chills or sore throat, or other symptoms of a cold or flu. Do not treat yourself. This drug decreases your  body's ability to fight infections. Try to avoid being around people who are sick. This medicine may increase your risk to bruise or bleed. Call your doctor or health care professional if you notice any unusual  bleeding. Check with your doctor or health care professional if you get an attack of severe diarrhea, nausea and vomiting, or if you sweat a lot. The loss of too much body fluid can make it dangerous for you to take this medicine. Talk to your doctor about your risk of cancer. You may be more at risk for certain types of cancers if you take this medicine. Both men and women must use effective birth control with this medicine. Do not become pregnant while taking this medicine or until at least 1 normal menstrual cycle has occurred after stopping it. Women should inform their doctor if they wish to become pregnant or think they might be pregnant. Men should not father a child while taking this medicine and for 3 months after stopping it. There is a potential for serious side effects to an unborn child. Talk to your health care professional or pharmacist for more information. Do not breast-feed an infant while taking this medicine. What side effects may I notice from receiving this medicine? Side effects that you should report to your doctor or health care professional as soon as possible:  allergic reactions like skin rash, itching or hives, swelling of the face, lips, or tongue  breathing problems or shortness of breath  diarrhea  dry, nonproductive cough  low blood counts - this medicine may decrease the number of white blood cells, red blood cells and platelets. You may be at increased risk for infections and bleeding.  mouth sores  redness, blistering, peeling or loosening of the skin, including inside the mouth  signs of infection - fever or chills, cough, sore throat, pain or trouble passing urine  signs and symptoms of bleeding such as bloody or black, tarry stools; red or dark-brown urine; spitting up blood or brown material that looks like coffee grounds; red spots on the skin; unusual bruising or bleeding from the eye, gums, or nose  signs and symptoms of kidney injury like trouble  passing urine or change in the amount of urine  signs and symptoms of liver injury like dark yellow or brown urine; general ill feeling or flu-like symptoms; light-colored stools; loss of appetite; nausea; right upper belly pain; unusually weak or tired; yellowing of the eyes or skin Side effects that usually do not require medical attention (report to your doctor or health care professional if they continue or are bothersome):  dizziness  hair loss  tiredness  upset stomach  vomiting This list may not describe all possible side effects. Call your doctor for medical advice about side effects. You may report side effects to FDA at 1-800-FDA-1088. Where should I keep my medicine? Keep out of the reach of children. Store at room temperature between 20 and 25 degrees C (68 and 77 degrees F). Protect from light. Throw away any unused medicine after the expiration date. NOTE: This sheet is a summary. It may not cover all possible information. If you have questions about this medicine, talk to your doctor, pharmacist, or health care provider.  2020 Elsevier/Gold Standard (2016-11-29 13:38:43)   Standing Labs We placed an order today for your standing lab work.    Please come back and get your standing labs in 2 weeks x 2, then 2 months, then every 3 months after starting methotrexate.  We have open lab daily Monday through Thursday from 8:30-12:30 PM and 1:30-4:30 PM and Friday from 8:30-12:30 PM and 1:30-4:00 PM at the office of Dr. Pollyann Savoy.   You may experience shorter wait times on Monday and Friday afternoons. The office is located at [address], [address], [place], [place] [address] No appointment is necessary.   Labs are drawn by [facility].  You may receive a bill from University for your lab work.  If you wish to have your labs drawn at another location, please call the office 24 hours in advance to send orders.  If you have any questions regarding directions or hours of  operation,  please call [phone].   Just as a reminder please drink plenty of water prior to coming for your lab work. Thanks!

## 2019-05-20 ENCOUNTER — Ambulatory Visit (HOSPITAL_COMMUNITY)
Admission: RE | Admit: 2019-05-20 | Discharge: 2019-05-20 | Disposition: A | Discharge status: Home / Self Care | Payer: BC Managed Care – PPO | Source: Ambulatory Visit | Attending: Physician Assistant | Admitting: Physician Assistant

## 2019-05-20 ENCOUNTER — Other Ambulatory Visit: Payer: Self-pay

## 2019-05-20 DIAGNOSIS — Z79899 Other long term (current) drug therapy: Secondary | ICD-10-CM | POA: Diagnosis present

## 2019-05-20 NOTE — Progress Notes (Signed)
CXR did not reveal any active cardiopulmonary disease.

## 2019-05-21 ENCOUNTER — Telehealth: Payer: Self-pay | Admitting: Rheumatology

## 2019-05-21 MED ORDER — FOLIC ACID 1 MG PO TABS: 2.0000 mg | ORAL_TABLET | Freq: Every day | ORAL | 3 refills | Status: AC

## 2019-05-21 MED ORDER — METHOTREXATE 2.5 MG PO TABS: ORAL_TABLET | ORAL | 0 refills | Status: AC

## 2019-05-21 NOTE — Telephone Encounter (Signed)
Faxed consent form to patient as requested.

## 2019-05-21 NOTE — Addendum Note (Signed)
Addended by: Henriette Combs on: 05/21/2019 03:25 PM   Modules accepted: Orders

## 2019-05-21 NOTE — Telephone Encounter (Signed)
Patient called requesting her medication consent form for Methotrexate be faxed to [phone].  Please advise

## 2019-05-21 NOTE — Telephone Encounter (Signed)
Received Consent form for MTX.

## 2019-05-25 NOTE — Progress Notes (Deleted)
Office Visit Note  Patient: Lisa Hall             Date of Birth: 08/31/1985           MRN: [id number]             PCP: Sonny Masters, FNP Referring: Sonny Masters, FNP Visit Date: 06/02/2019 Occupation: @GUAROCC @  Subjective:  No chief complaint on file.   History of Present Illness: Lisa Hall is a 34 y.o. female ***   Activities of Daily Living:  Patient reports morning stiffness for *** {minute/hour:19697}.   Patient {ACTIONS;DENIES/REPORTS:21021675::"Denies"} nocturnal pain.  Difficulty dressing/grooming: {ACTIONS;DENIES/REPORTS:21021675::"Denies"} Difficulty climbing stairs: {ACTIONS;DENIES/REPORTS:21021675::"Denies"} Difficulty getting out of chair: {ACTIONS;DENIES/REPORTS:21021675::"Denies"} Difficulty using hands for taps, buttons, cutlery, and/or writing: {ACTIONS;DENIES/REPORTS:21021675::"Denies"}  No Rheumatology ROS completed.   PMFS History:  Patient Active Problem List   Diagnosis Date Noted  . Mixed hyperlipidemia 11/20/2018  . Rheumatoid arthritis (HCC) 11/20/2018  . Chronic pain of both knees 11/20/2018  . Gastroesophageal reflux disease without esophagitis 09/16/2018  . BMI 29.0-29.9,adult 09/16/2018  . Abnormal glucose tolerance in pregnancy 05/06/2018  . History of pre-eclampsia 02/10/2018    Past Medical History:  Diagnosis Date  . Elevated BP without diagnosis of hypertension 06/18/2018  . Preeclampsia   . Rheumatoid arthritis (HCC)     Family History  Problem Relation Age of Onset  . Hypertension Mother   . Diabetes Mother    Past Surgical History:  Procedure Laterality Date  . DILATION AND CURETTAGE OF UTERUS     Social History   Social History Narrative  . Not on file   Immunization History  Administered Date(s) Administered  . Influenza,inj,Quad PF,6+ Mos 02/10/2018  . Pneumococcal Polysaccharide-23 06/20/2018  . Tdap 05/01/2018     Objective: Vital Signs: LMP 05/04/2019    Physical Exam   Musculoskeletal Exam:  ***  CDAI Exam: CDAI Score: -- Patient Global: --; Provider Global: -- Swollen: --; Tender: -- Joint Exam 06/02/2019   No joint exam has been documented for this visit   There is currently no information documented on the homunculus. Go to the Rheumatology activity and complete the homunculus joint exam.  Investigation: No additional findings.  Imaging: DG Chest 2 View  Result Date: 05/20/2019 CLINICAL DATA:  Baseline, history of lupus, starting methotrexate EXAM: CHEST - 2 VIEW COMPARISON:  None. FINDINGS: The heart size and mediastinal contours are within normal limits. Both lungs are clear. The visualized skeletal structures are unremarkable. IMPRESSION: No active cardiopulmonary disease. Electronically Signed   By: 05/22/2019 M.D.   On: 05/20/2019 16:48    Recent Labs: Lab Results  Component Value Date   WBC 10.9 (H) 02/23/2019   HGB 13.9 02/23/2019   PLT 351 02/23/2019   NA 138 02/23/2019   K 4.3 02/23/2019   CL 103 02/23/2019   CO2 21 02/23/2019   GLUCOSE 89 02/23/2019   BUN 13 02/23/2019   CREATININE 0.64 02/23/2019   BILITOT 0.3 02/23/2019   ALKPHOS 107 09/16/2018   AST 16 02/23/2019   ALT 19 02/23/2019   PROT 7.5 02/23/2019   PROT 7.4 02/23/2019   ALBUMIN 4.4 09/16/2018   CALCIUM 9.7 02/23/2019   GFRAA 138 02/23/2019   QFTBGOLDPLUS NEGATIVE 02/23/2019    Speciality Comments: No specialty comments available.  Procedures:  No procedures performed Allergies: Plaquenil [hydroxychloroquine]   Assessment / Plan:     Visit Diagnoses: No diagnosis found.  Orders: No orders of the defined types were placed in this encounter.  No orders of the defined types were placed in this encounter.   Face-to-face time spent with patient was *** minutes. Greater than 50% of time was spent in counseling and coordination of care.  Follow-Up Instructions: No follow-ups on file.   Ofilia Neas, PA-C  Note - This record has been created using Dragon software.   Chart creation errors have been sought, but may not always  have been located. Such creation errors do not reflect on  the standard of medical care.

## 2019-06-02 ENCOUNTER — Ambulatory Visit: Payer: Medicaid Other | Admitting: Rheumatology

## 2019-06-10 ENCOUNTER — Other Ambulatory Visit: Payer: Self-pay | Admitting: Rheumatology

## 2019-06-10 NOTE — Telephone Encounter (Signed)
Attempted to contact the patient and unable to leave a message, mailbox is full. Patient due for labs.

## 2019-06-12 NOTE — Telephone Encounter (Signed)
Attempted to contact the patient and unable to leave a message. Mailbox is full.  

## 2019-06-24 ENCOUNTER — Encounter: Payer: Self-pay | Admitting: Family Medicine

## 2020-04-16 IMAGING — DX DG CHEST 2V
2 series · 2 of 2 positions shown · non-contrast
Comparison: None.

CLINICAL DATA: Baseline, history of lupus, starting methotrexate

EXAM:
CHEST - 2 VIEW

[chest pa]
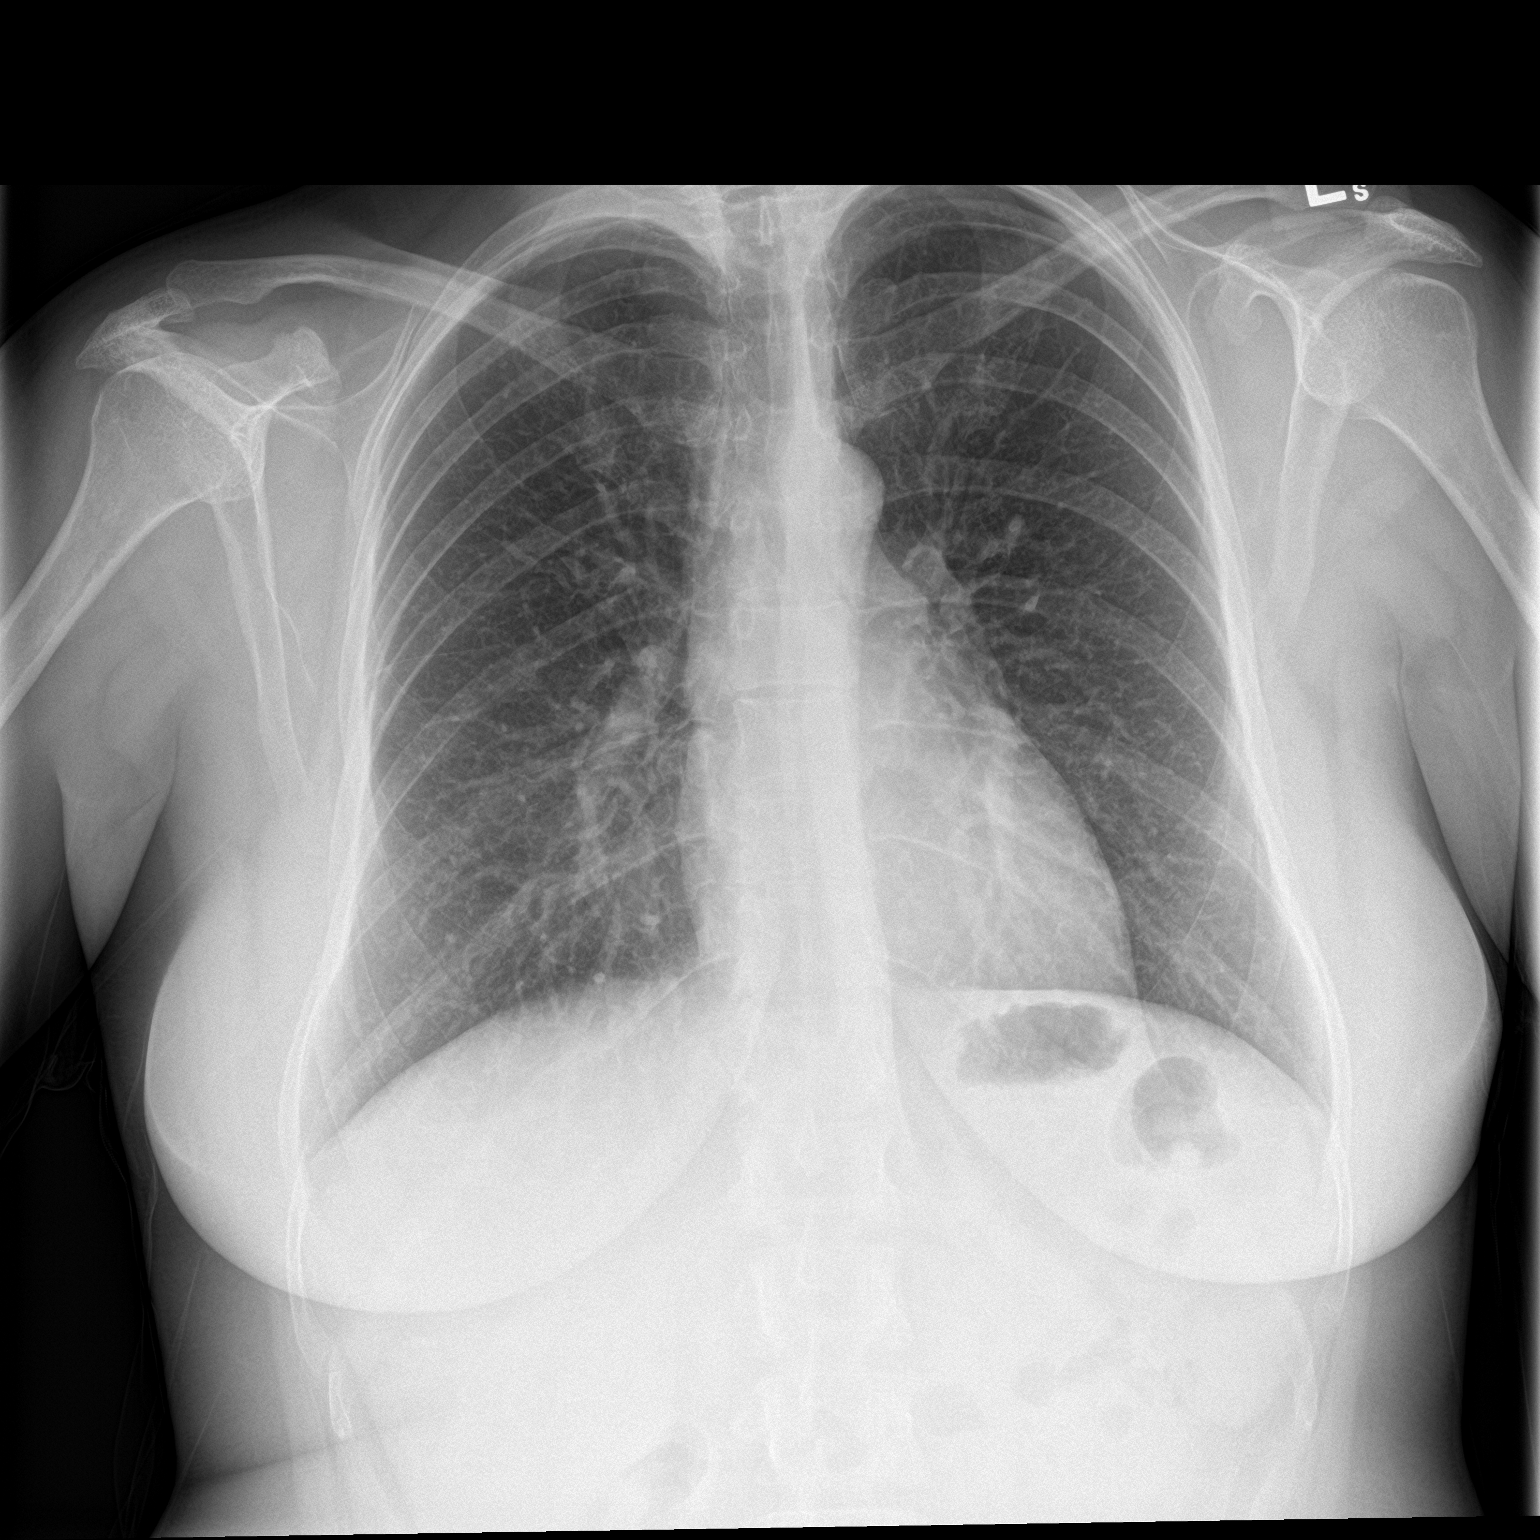

[chest lat]
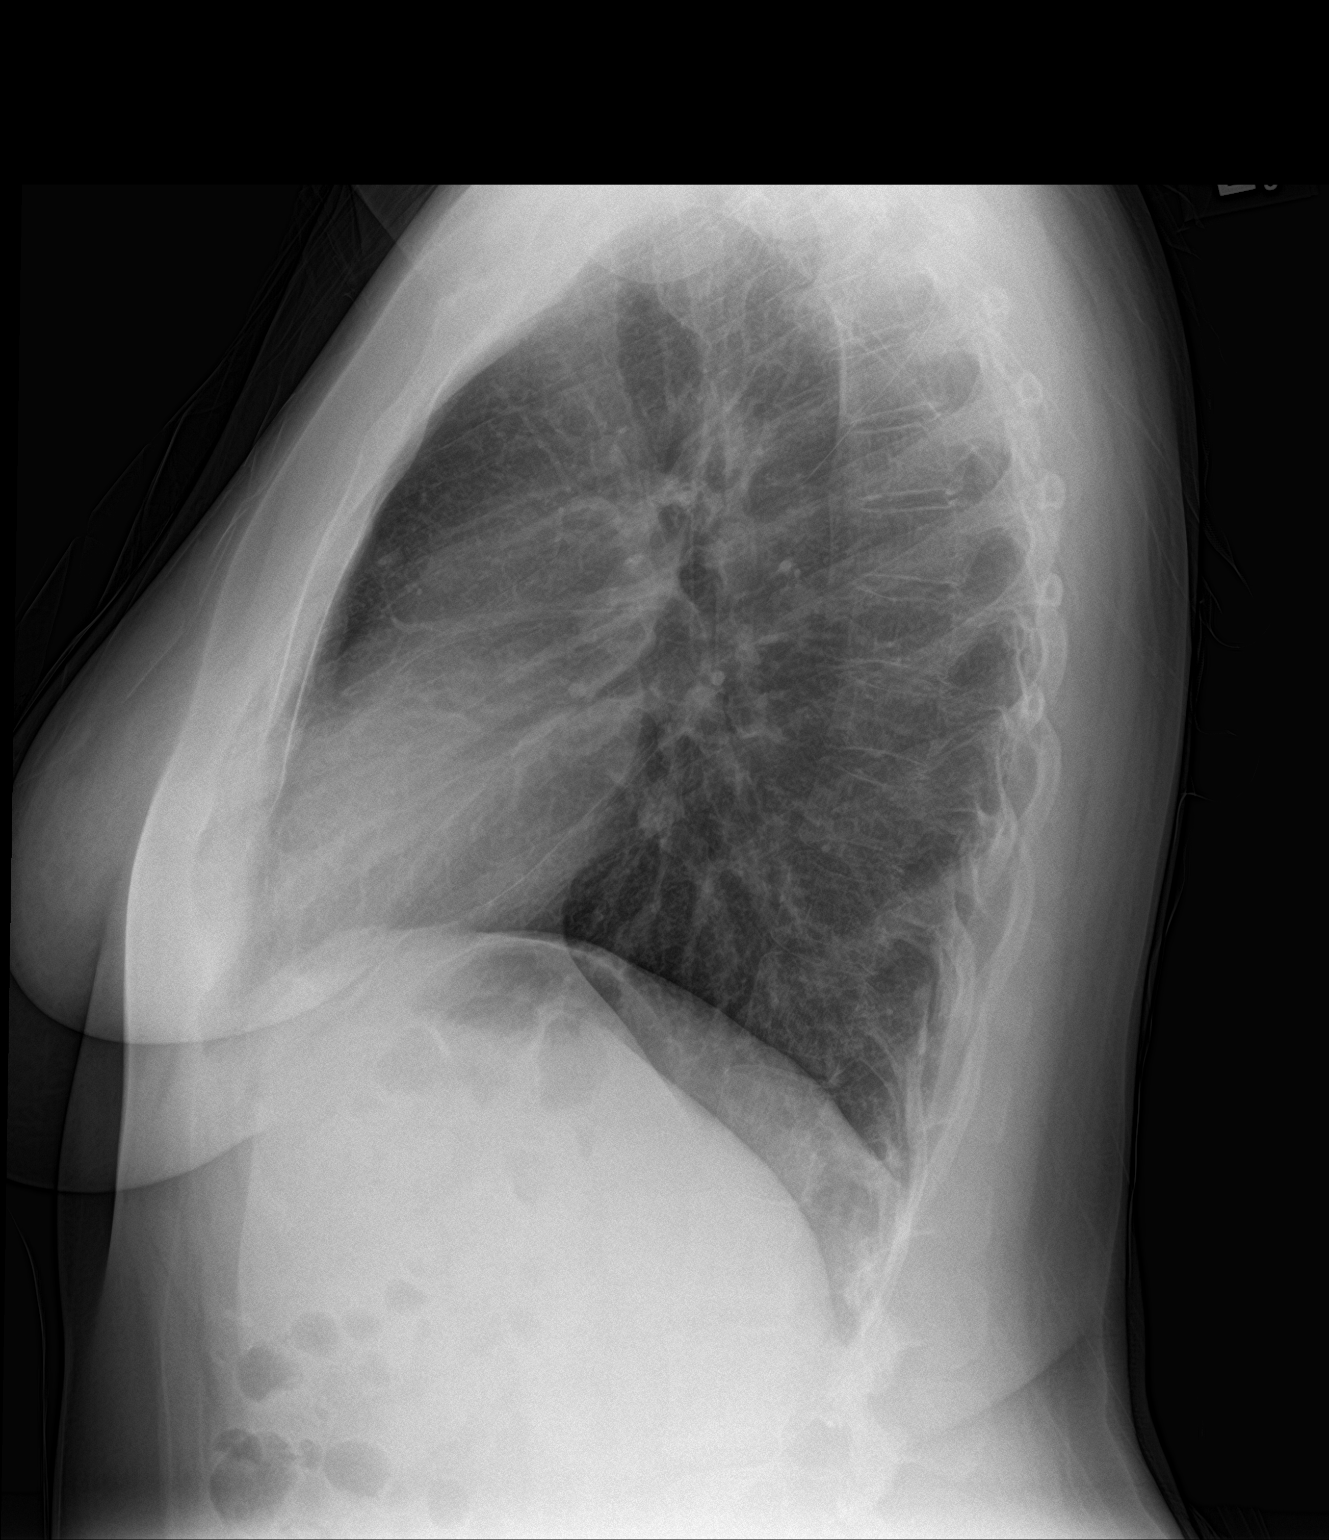

[2 of 2 positions shown; findings below may reference images not displayed]

FINDINGS: The heart size and mediastinal contours are within normal limits.
Both lungs are clear. The visualized skeletal structures are
unremarkable.
IMPRESSION: No active cardiopulmonary disease.

## 2022-02-19 ENCOUNTER — Telehealth: Payer: Self-pay | Admitting: *Deleted

## 2022-02-19 NOTE — Telephone Encounter (Signed)
Patient's mother contacted the office stating she was calling on behalf of the patient. Patient's mother is not on dpr and advised we are unable to release any information to her. She states she is calling due to patient being incarcerated. Advised her we are still unable to release or discuss any information with her. She expressed understanding.
